# Patient Record
Sex: Female | Born: 1970 | Race: White | Hispanic: No | Smoking: Current every day smoker
Health system: Southern US, Community
[De-identification: ages and names within clinical notes are randomized; demographics above are authoritative.]

## PROBLEM LIST (undated history)

## (undated) DIAGNOSIS — J449 Chronic obstructive pulmonary disease, unspecified: Secondary | ICD-10-CM

## (undated) DIAGNOSIS — F32A Depression, unspecified: Secondary | ICD-10-CM

## (undated) DIAGNOSIS — E118 Type 2 diabetes mellitus with unspecified complications: Secondary | ICD-10-CM

## (undated) DIAGNOSIS — E669 Obesity, unspecified: Secondary | ICD-10-CM

## (undated) DIAGNOSIS — M1990 Unspecified osteoarthritis, unspecified site: Secondary | ICD-10-CM

## (undated) DIAGNOSIS — E781 Pure hyperglyceridemia: Secondary | ICD-10-CM

## (undated) DIAGNOSIS — F419 Anxiety disorder, unspecified: Secondary | ICD-10-CM

## (undated) DIAGNOSIS — I214 Non-ST elevation (NSTEMI) myocardial infarction: Secondary | ICD-10-CM

## (undated) DIAGNOSIS — I2510 Atherosclerotic heart disease of native coronary artery without angina pectoris: Secondary | ICD-10-CM

## (undated) DIAGNOSIS — Z720 Tobacco use: Secondary | ICD-10-CM

## (undated) DIAGNOSIS — I10 Essential (primary) hypertension: Secondary | ICD-10-CM

## (undated) DIAGNOSIS — E785 Hyperlipidemia, unspecified: Secondary | ICD-10-CM

## (undated) HISTORY — DX: Chronic obstructive pulmonary disease, unspecified: J44.9

## (undated) HISTORY — PX: CHOLECYSTECTOMY: SHX55

---

## 2002-03-24 ENCOUNTER — Emergency Department (HOSPITAL_COMMUNITY): Admission: EM | Admit: 2002-03-24 | Discharge: 2002-03-24 | Payer: Self-pay | Admitting: Emergency Medicine

## 2002-03-24 ENCOUNTER — Encounter: Payer: Self-pay | Admitting: Emergency Medicine

## 2003-03-22 ENCOUNTER — Encounter: Payer: Self-pay | Admitting: Emergency Medicine

## 2003-03-22 ENCOUNTER — Emergency Department (HOSPITAL_COMMUNITY): Admission: EM | Admit: 2003-03-22 | Discharge: 2003-03-22 | Payer: Self-pay | Admitting: Emergency Medicine

## 2004-07-29 ENCOUNTER — Inpatient Hospital Stay (HOSPITAL_COMMUNITY): Admission: EM | Admit: 2004-07-29 | Discharge: 2004-07-31 | Payer: Self-pay | Admitting: *Deleted

## 2004-08-23 ENCOUNTER — Emergency Department (HOSPITAL_COMMUNITY): Admission: EM | Admit: 2004-08-23 | Discharge: 2004-08-23 | Payer: Self-pay | Admitting: Emergency Medicine

## 2004-11-04 ENCOUNTER — Emergency Department (HOSPITAL_COMMUNITY): Admission: EM | Admit: 2004-11-04 | Discharge: 2004-11-04 | Payer: Self-pay | Admitting: Emergency Medicine

## 2006-05-01 ENCOUNTER — Emergency Department (HOSPITAL_COMMUNITY): Admission: EM | Admit: 2006-05-01 | Discharge: 2006-05-01 | Payer: Self-pay | Admitting: Emergency Medicine

## 2006-10-08 ENCOUNTER — Emergency Department (HOSPITAL_COMMUNITY): Admission: EM | Admit: 2006-10-08 | Discharge: 2006-10-08 | Payer: Self-pay | Admitting: Emergency Medicine

## 2006-10-29 ENCOUNTER — Emergency Department (HOSPITAL_COMMUNITY): Admission: EM | Admit: 2006-10-29 | Discharge: 2006-10-30 | Payer: Self-pay | Admitting: Emergency Medicine

## 2007-07-24 ENCOUNTER — Emergency Department (HOSPITAL_COMMUNITY): Admission: EM | Admit: 2007-07-24 | Discharge: 2007-07-24 | Payer: Self-pay | Admitting: Emergency Medicine

## 2007-09-30 ENCOUNTER — Emergency Department (HOSPITAL_COMMUNITY): Admission: EM | Admit: 2007-09-30 | Discharge: 2007-09-30 | Payer: Self-pay | Admitting: Emergency Medicine

## 2007-10-30 ENCOUNTER — Ambulatory Visit: Payer: Self-pay | Admitting: Cardiovascular Disease

## 2007-11-01 ENCOUNTER — Ambulatory Visit (HOSPITAL_COMMUNITY): Admission: RE | Admit: 2007-11-01 | Discharge: 2007-11-01 | Payer: Self-pay | Admitting: Cardiovascular Disease

## 2007-11-01 ENCOUNTER — Ambulatory Visit: Payer: Self-pay | Admitting: Cardiovascular Disease

## 2007-11-13 ENCOUNTER — Ambulatory Visit: Payer: Self-pay | Admitting: Cardiovascular Disease

## 2008-05-11 ENCOUNTER — Emergency Department (HOSPITAL_COMMUNITY): Admission: EM | Admit: 2008-05-11 | Discharge: 2008-05-11 | Payer: Self-pay | Admitting: Emergency Medicine

## 2008-10-03 ENCOUNTER — Emergency Department (HOSPITAL_COMMUNITY): Admission: EM | Admit: 2008-10-03 | Discharge: 2008-10-03 | Payer: Self-pay | Admitting: Emergency Medicine

## 2008-10-19 ENCOUNTER — Emergency Department (HOSPITAL_COMMUNITY): Admission: EM | Admit: 2008-10-19 | Discharge: 2008-10-19 | Payer: Self-pay | Admitting: Emergency Medicine

## 2010-06-04 ENCOUNTER — Emergency Department (HOSPITAL_COMMUNITY): Admission: EM | Admit: 2010-06-04 | Discharge: 2010-06-04 | Payer: Self-pay | Admitting: Emergency Medicine

## 2010-09-20 LAB — POCT I-STAT, CHEM 8
BUN: 11 mg/dL (ref 6–23)
Calcium, Ion: 1.08 mmol/L — ABNORMAL LOW (ref 1.12–1.32)
Chloride: 107 mEq/L (ref 96–112)
Creatinine, Ser: 1.1 mg/dL (ref 0.4–1.2)
Glucose, Bld: 159 mg/dL — ABNORMAL HIGH (ref 70–99)
HCT: 45 % (ref 36.0–46.0)
Hemoglobin: 15.3 g/dL — ABNORMAL HIGH (ref 12.0–15.0)
Potassium: 2.9 mEq/L — ABNORMAL LOW (ref 3.5–5.1)
Sodium: 140 mEq/L (ref 135–145)
TCO2: 25 mmol/L (ref 0–100)

## 2010-10-20 LAB — URINALYSIS, ROUTINE W REFLEX MICROSCOPIC
Glucose, UA: NEGATIVE mg/dL
Ketones, ur: NEGATIVE mg/dL
Leukocytes, UA: NEGATIVE
Nitrite: NEGATIVE
Protein, ur: 30 mg/dL — AB

## 2010-10-20 LAB — COMPREHENSIVE METABOLIC PANEL
ALT: 15 U/L (ref 0–35)
Alkaline Phosphatase: 63 U/L (ref 39–117)
CO2: 25 mEq/L (ref 19–32)
GFR calc non Af Amer: >60 mL/min (ref >60)
Glucose, Bld: 114 mg/dL — ABNORMAL HIGH (ref 70–99)
Potassium: 3.7 mEq/L (ref 3.5–5.1)
Sodium: 135 mEq/L (ref 135–145)
Total Bilirubin: 0.2 mg/dL — ABNORMAL LOW (ref 0.3–1.2)

## 2010-10-20 LAB — STOOL CULTURE

## 2010-10-20 LAB — OVA AND PARASITE EXAMINATION

## 2010-10-20 LAB — CBC
Hemoglobin: 13.8 g/dL (ref 12.0–15.0)
RBC: 4.39 MIL/uL (ref 3.87–5.11)
WBC: 11.4 10*3/uL — ABNORMAL HIGH (ref 4.0–10.5)

## 2010-10-20 LAB — URINE MICROSCOPIC-ADD ON

## 2010-10-20 LAB — CLOSTRIDIUM DIFFICILE EIA: C difficile Toxins A+B, EIA: NEGATIVE

## 2010-10-20 LAB — DIFFERENTIAL
Basophils Absolute: 0 10*3/uL (ref 0.0–0.1)
Basophils Relative: 0 % (ref 0–1)
Eosinophils Absolute: 0.1 10*3/uL (ref 0.0–0.7)
Eosinophils Relative: 1 % (ref 0–5)

## 2010-11-22 NOTE — Assessment & Plan Note (Signed)
[facility]                        CARDIOLOGY OFFICE NOTE   LA, Alyssa                       MRN:          [id number]  DATE:10/30/2007                            DOB:          08-19-1970    HISTORY:  [name] is a 36-year patient referred by [facility] for chest pain.  The patient was newly diagnosed  hypertensive, hyperlipidemic and diabetic.  This was at the end of  March.  She went to the emergency room at [facility] at the end of March  for chest pain and ruled out.   She continues to have pain.  The pain initially started off as fairly  atypical being somewhat sharp pain in the center of her chest.  It could  last for hours.  It happened when she is cleaning.  However over the  last 2 weeks, it has been more typical.  The pain is exertional.  It  radiates to her shoulder.  She is having 3-4 episodes a week.   The patient is extremely fearful.  She has 2 teenage kids at home and  was just diagnosed with all these risk factors, and is fearful that she  is going to drop dead.   I told the patient that her pain was initially atypical.  She had a  reassuring visit to the ER and has a normal EKG.  However, the fact that  she is getting exertional chest pain and is a diabetic with  hypercholesterolemia, and has been smoking for over 40 pack years, makes  me concerned.  The patient and I both agree that a diagnostic heart cath  would be in order.   The risks including bleeding, stroke, need for a blood transfusion or  emergency surgery were discussed.  She is willing to proceed.  We will  try to arrange this for Friday.   REVIEW OF SYSTEMS:  Otherwise negative, in particular she has not had  palpitations.  She has mild chronic exertional dyspnea due to her  smoking and clinical COPD as well as her central obesity.   PAST MEDICAL HISTORY:  1. Remarkable for recent diabetes.  2. Hypertension.  3. Smoking.  4. She had previous gallbladder surgery in 2003.   ALLERGIES:  NO KNOWN DRUG ALLERGIES.   SOCIAL HISTORY:  She smokes 1-2 packs a day.  She is doing this despite  being on Wellbutrin now.  The patient is unemployed.  She has a  longstanding boyfriend at home.  She has a 75 year old and a 108 year old  child at home.  She actually has a third child who is not living with  her.  She is sedentary.   FAMILY HISTORY:  Father died at age 47 of an MI.  Mother is still alive.   MEDICATIONS:  1. Wellbutrin 150 a day.  2. Metformin 500 b.i.d.  3. Lisinopril 20 a day.   PHYSICAL EXAMINATION:  GENERAL:  Remarkable for an overweight,  bronchitic female in no distress.  She is fairly anxious.  VITAL SIGNS:  Blood pressure is 100/80, pulse 72 and regular, weight  212, respiratory rate 14.  HEENT:  Unremarkable.  NECK:  Carotids are normal without bruit.  No lymphadenopathy,  thyromegaly, JVP elevation.  LUNGS:  Clear with mild end-expiratory wheezes.  Good diaphragmatic  motion.  HEART:  S1-S2 with distant sounds.  PMI normal.  ABDOMEN:  Benign.  Bowel sounds are positive.  No AAA.  No tenderness.  No bruit.  No hepatosplenomegaly or hepatojugular reflux.  Status post  gallbladder surgery.  EXTREMITIES:  Distal pulses are intact.  No edema.  No muscular  weakness.  NEURO:  Nonfocal.  SKIN:  Warm and dry.   DIAGNOSTICS:  EKG is normal.   IMPRESSION:  1. The patient is having chest pain with worrisome risk factors and an      exertional component.  Diagnostic heart cath will be done this      Friday.  The patient will have lab work done.  She had a chest x-      ray at the end of March which we will review and should suffice.  2. Hypertension.  Continue lisinopril 20 a day.  I may have her hold      this prior to her heart cath.  3. Metformin with diabetes to hold prior to surgery and heart cath due      to risk of lactic acidosis.  4. Smoking cessation.  Continue Wellbutrin.  Will need  baseline PFTs      and an inhaler given her clinical chronic obstructive pulmonary      disease.   Further recommendations based on results of heart cath.     Alyssa Morales. Eden Emms, MD, [facility]  Electronically Signed    PCN/MedQ  DD: 10/30/2007  DT: 10/30/2007  Job #: [id number]   cc:   [facility]  [place], [facility]

## 2010-11-25 NOTE — Cardiovascular Report (Signed)
Alyssa Morales, Alyssa Morales                ACCOUNT NO.:  [id number]   MEDICAL RECORD NO.:  [id number]           PATIENT TYPE:   LOCATION:                                 FACILITY:   PHYSICIAN:  Peter C. Eden Emms, MD, FACCDATE OF BIRTH:  12-Apr-1971   DATE OF PROCEDURE:  11/13/2007  DATE OF DISCHARGE:                            CARDIAC CATHETERIZATION   Coronary arteriography.   INDICATIONS:  Obese white female with multiple coronary risk factors  including diabetes and chest pain.   Cine catheterization was done with 4-French catheters from right femoral  artery.   Left main coronary artery was a long segment which was normal.   Left anterior descending artery was normal.  The distal vessel was  somewhat small.   The patient had a large branching first diagonal branch which was  normal.   The second diagonal branch and third diagonal branch were small without  significant disease.   The circumflex coronary artery was nondominant.   It primarily consisted of a single large obtuse marginal branch and was  normal.   The right coronary artery was dominant.  It was normal.   The patient had rather extensive distal posterior lateral branch.   RAO VENTRICULOGRAPHY:  RAO ventriculography was normal, EF was 65%.  There was no gradient across the aortic valve and no MR.  LV pressure  was 160/80.  Aortic pressure was 130/80.  LV pressure was 130/15.   IMPRESSION:  The patient has no significant coronary artery disease.  She will be discharged later today as long as her groin heals well.  She  needs to continue risk factor modifications.  She was given an albuterol  inhaler to help with her asthmatic bronchitis and smoking.      Alyssa Morales. Eden Emms, MD, [facility]  Electronically Signed     PCN/MEDQ  D:  11/13/2007  T:  11/14/2007  Job:  [id number]

## 2010-11-25 NOTE — Assessment & Plan Note (Signed)
[facility]                       [name] CARDIOLOGY OFFICE NOTE   Alyssa Morales, Alyssa Morales                       MRN:          [id number]  DATE:11/13/2007                            DOB:          1971/06/10    Alyssa Morales returns in follow-up.  I catheterized her about a week ago.  She had multiple coronary risk factors including hypertension, diabetes  and smoking with chest pain.  Her catheterization showed normal  coronaries with good LV function.   Her leg is healed well since discharge.   She continues to have atypical pain and a bronchitic cough.  She has had  some benefit from an inhaler I prescribed for her.   She needs to [facility]  her diabetes and risk factors.  There is no fasting lipid profile on  her.  Given her weight and diabetes, I suspect she should be on a  statin.  I told her I would give her a prescription for simvastatin 40  mg and we will check her lipids this week.   REVIEW OF SYSTEMS:  Otherwise negative.  In particular, her groin is  healed well.  She is not having any significant chest pain and has had  relief from her albuterol inhaler.   MEDICATIONS:  1. Wellbutrin 150 mg a day.  2. Metformin 500 mg b.i.d.  3. Lisinopril 20 mg a day.  4. A baby aspirin a day.   EXAM:  Remarkable for an overweight white female in no distress.  Weight is 207, blood pressure is 110/76, pulse 76 and regular, afebrile,  respiratory rate 14.  HEENT:  Unremarkable.  Carotids normal without bruit.  No lymphadenopathy, thyromegaly or JVP  elevation.  LUNGS:  Clear with good diaphragmatic motion.  No wheezing.  ABDOMEN:  Protuberant.  Bowel sounds positive.  No bruit.  No  tenderness.  Status post cholecystectomy.  No hepatosplenomegaly or  hepatojugular reflux.  Groin site is well-healed without residual bruit.  Distal pulses are intact.  No edema.  NEUROLOGIC:  Nonfocal.  SKIN:  Warm and  dry.  No muscle weakness.   IMPRESSION:  1. Chest pain.  No evidence of coronary artery disease by      catheterization.  Continue aspirin a day.  2. Smoking.  Continue Wellbutrin.  The patient clinically has some      bronchitis.  Continue albuterol inhaler.  3. Diabetes.  Follow up with Dr. Jorene Guest and [name].  Continue      metformin 500 mg b.i.d.  Continue to check sugars post meal.      Hemoglobin A1c quarterly.  4. Hypertension.  Continue lisinopril 20 mg a day.  Blood pressure      under good control.  Renal protective effects from lisinopril also      beneficial.   We will see her back in a year.     Alyssa Morales. Eden Emms, MD, [facility]  Electronically Signed    PCN/MedQ  DD: 11/13/2007  DT: 11/13/2007  Job #: [id number]

## 2010-11-25 NOTE — Op Note (Signed)
Alyssa Morales, Alyssa Morales                ACCOUNT NO.:  [id number]   MEDICAL RECORD NO.:  [id number]          PATIENT TYPE:  INP   LOCATION:  A305                          FACILITY:  [facility]   PHYSICIAN:  Dirk Dress. Katrinka Blazing, M.D.   DATE OF BIRTH:  05-18-1971   DATE OF PROCEDURE:  07/30/2004  DATE OF DISCHARGE:                                 OPERATIVE REPORT   PREOPERATIVE DIAGNOSIS:  Acute cholecystitis, cholelithiasis   POSTOPERATIVE DIAGNOSIS:  Acute cholecystitis, cholelithiasis   PROCEDURE:  Laparoscopic cholecystectomy.   SURGEON:  Dr. Katrinka Blazing.   DESCRIPTION:  Under general endotracheal anesthesia, the patient's abdomen  was prepped and draped in  a sterile field. A supraumbilical incision was  made and Veress needle was inserted uneventfully. Abdomen was insufflated  with 2.9 liters of CO2. Using a Visiport guide, a 10-mm port was placed  uneventfully. Laparoscope was placed, and the gallbladder was visualized.  Under videoscopic guidance, a 10-mm port and two 5-mm ports were placed in  the right subcostal region. The gallbladder was grasped and then positioned.  Cystic duct was dissected close to the gallbladder. It was closed with five  clips and divided. There were two cystic artery branches, one anteriorly and  one posteriorly. These were followed up to the gallbladder wall. There were  clipped with three clips and divided consecutively. Using electrocautery,  gallbladder was then separated from the intrahepatic bed without difficulty.  The gallbladder was retrieved in an EndoCatch device. Clinically, there was  only a very large stone which was impacted in the cystic duct.   The subhepatic space was irrigated with saline. There was no bleeding and no  evidence of bile leak from the bed. CO2 was allowed to escape from the  abdomen, and the ports were removed. The incisions were closed using 0  Vicryl on the fascia at the umbilicus and staples each of the skin  incisions. The patient  tolerated the procedure well. Dressings were placed.  She was awakened from anesthesia without difficulty, transferred to a bed  and taken to the postanesthetic care unit for monitoring.      LCS/MEDQ  D:  07/30/2004  T:  07/30/2004  Job:  [id number]

## 2010-11-25 NOTE — Discharge Summary (Signed)
Alyssa Morales, Alyssa Morales                ACCOUNT NO.:  [id number]   MEDICAL RECORD NO.:  [id number]          PATIENT TYPE:  INP   LOCATION:  A305                          FACILITY:  [facility]   PHYSICIAN:  Dirk Dress. Katrinka Blazing, M.D.   DATE OF BIRTH:  [birth year]   DATE OF ADMISSION:  07/29/2004  DATE OF DISCHARGE:  01/22/2006LH                                 DISCHARGE SUMMARY   DISCHARGE DIAGNOSES:  1.  Acute cholecystitis/cholelithiasis.  2.  Gastroesophageal reflux disease.   SPECIAL PROCEDURE:  Laparoscopic cholecystectomy.   DISPOSITION:  The patient is discharged home in satisfactory condition. She  will be followed up in the office in two weeks.   DISCHARGE MEDICATIONS:  1.  Phenergan 25 mg q.4h. p.r.n. nausea.  2.  Vicodin 5/500 one q.i.d. p.r.n. pain.  3.  Prevacid 30 mg q.d.  4.  Lexapro 20 mg q.d.  5.  Wellbutrin XL 300 mg q.d.  6.  Allegra D 1 b.i.d.  7.  Klonopin 2 mg q.h.s.   SUMMARY:  A 40 year old female admitted with a history of acute onset  abdominal pain, nausea, vomiting. The patient was seen in the emergency room  where she was found to have a tender epigastrium. Ultrasound revealed  gallstones with thickened gallbladder wall. She had no fever. White count  was 11,700. She was admitted for IV antibiotics and was scheduled for  laparoscopic cholecystectomy. History suggests that she has had similar  episodes at least six times in the last two weeks.   PHYSICAL EXAMINATION:  GENERAL:  She was in no acute distress.  VITAL SIGNS:  Blood pressure was 123/84, pulse 79, respirations 20,  temperature 98.5.  ABDOMEN:  Revealed mild tenderness in the epigastrium.   STUDIES:  Ultrasound showed multiple gallstones with thickening of the  gallbladder wall but no pericholecystic fluid. Liver enzymes were normal.  Amylase and lipase were normal.   The patient was admitted, started on IV antibiotics and antiemetics. She  underwent laparoscopic cholecystectomy on January 21 with the  finding of  acute cholecystitis with cholecystectomy. She had an uneventful  postoperative course and was discharged home on the first postoperative day  in satisfactory condition.      LCS/MEDQ  D:  09/04/2004  T:  09/05/2004  Job:  [id number]

## 2010-11-25 NOTE — H&P (Signed)
Alyssa Morales, Alyssa Morales                ACCOUNT NO.:  [id number]   MEDICAL RECORD NO.:  [id number]          PATIENT TYPE:  INP   LOCATION:  A305                          FACILITY:  [facility]   PHYSICIAN:  Dirk Dress. Katrinka Blazing, M.D.   DATE OF BIRTH:  01-22-1971   DATE OF ADMISSION:  07/29/2004  DATE OF DISCHARGE:  LH                                HISTORY & PHYSICAL   HISTORY OF PRESENT ILLNESS:  The patient is a 40 year old female admitted  with a history of acute onset of abdominal pain, nausea and vomiting while  sleeping in the early morning of admission.  The patient was later seen in  the emergency room where on examination, she was found to have a tender  epigastrium.  An ultrasound of the abdomen revealed gallstones with a  thickened gallbladder wall compatible with acute cholecystitis.  The patient  had no fever, and only had a white count of 11,700.  She was treated  symptomatically and was improved, but she still had significant tenderness.  She is admitted for IV antibiotics.  We will schedule her for a laparoscopic  cholecystectomy.  The patient states that she has had similar episodes at  least six times in the last two weeks.   PAST MEDICAL HISTORY:  Positive for gastroesophageal reflux disease,  depression and seasonal allergies.   MEDICATIONS:  1.  Prevacid 30 mg daily.  2.  Lexapro 20 mg daily.  3.  Wellbutrin XL 300 mg daily.  4.  Allegra D, b.i.d.  5.  Klonopin 1 mg, two tablets q.h.s.   REVIEW OF SYSTEMS:  Positive for irregular menses and mild depression and  anxiety.  Her only surgery was a C-section in 1989.   FAMILY HISTORY:  Positive for atherosclerotic heart disease in her father.   SOCIAL HISTORY:  She is a divorced mother of three children, employed.  She  smokes one pack of cigarettes per day.   PHYSICAL EXAMINATION:  GENERAL:  She is a pleasant female in no acute  distress at the time of my exam.  VITAL SIGNS:  Blood pressure is 123/84, pulse is 79,  respirations 20,  temperature 98.5, oxygen saturation is 98% on room air.  HEENT:  Unremarkable.  There is no evidence of jaundice.  Oral mucosa is  normal.  There is no bilious staining.  No evidence of dehydration  clinically.  NECK:  Supple, no JVD, bruits, adenopathy or thyromegaly.  CHEST:  Clear to auscultation.  HEART:  Regular rate and rhythm without murmur, gallop or rub.  ABDOMEN:  Obese but soft.  Mild tenderness in the epigastrium.  No right  upper quadrant tenderness. No masses, no guarding.  EXTREMITIES:  No cyanosis, clubbing or edema.  No joint deformity.  NEUROLOGIC:  No focal, motor, sensory or cerebellar deficits.   LABORATORY DATA:  White count 11,700, hemoglobin 13.1, hematocrit 37.  Potassium 3.6.  BUN 13, creatinine 1.  SGOT/SGPT, alkaline phosphatase and  bilirubin are normal.  Lipase, amylase are normal.  Cardiac enzymes were  negative for myocardial injury.  Her D-Dimer was 0.35.  Urinalysis  was  normal.   Chest x-ray showed no evidence of acute cardiopulmonary disease.   Upper abdominal ultrasound shows multiple gallstones with thickening of the  gallbladder wall, but no pericholecystic fluid.   IMPRESSION:  1.  Acute cholecystitis with cholelithiasis.  2.  Gastroesophageal reflux disease by history.  3.  Seasonal allergies.  4.  Depression.   PLAN:  The patient is admitted. She will be given antiemetics and  analgesics.  She will also be given Ancef for antibiotic coverage.  Since  she has not been n.p.o., we will wait and do surgery on the morning of  July 30, 2004.  The patient has been counseled for the procedure, and she  agrees to follow through with surgery on the morning of July 30, 2004.      LCS/MEDQ  D:  07/29/2004  T:  07/29/2004  Job:  [id number]   cc:   [facility]  Fax: [phone]   Laural Benes, Dr.  Roda Shutters [facility]  [place], [place].

## 2011-03-04 ENCOUNTER — Encounter: Payer: Self-pay | Admitting: Emergency Medicine

## 2011-03-04 ENCOUNTER — Emergency Department (HOSPITAL_COMMUNITY)
Admission: EM | Admit: 2011-03-04 | Discharge: 2011-03-04 | Disposition: A | Discharge status: Home / Self Care | Payer: Self-pay | Attending: Emergency Medicine | Admitting: Emergency Medicine

## 2011-03-04 DIAGNOSIS — Y92009 Unspecified place in unspecified non-institutional (private) residence as the place of occurrence of the external cause: Secondary | ICD-10-CM | POA: Insufficient documentation

## 2011-03-04 DIAGNOSIS — IMO0002 Reserved for concepts with insufficient information to code with codable children: Secondary | ICD-10-CM | POA: Insufficient documentation

## 2011-03-04 DIAGNOSIS — X58XXXA Exposure to other specified factors, initial encounter: Secondary | ICD-10-CM | POA: Insufficient documentation

## 2011-03-04 DIAGNOSIS — F17200 Nicotine dependence, unspecified, uncomplicated: Secondary | ICD-10-CM | POA: Insufficient documentation

## 2011-03-04 DIAGNOSIS — E119 Type 2 diabetes mellitus without complications: Secondary | ICD-10-CM | POA: Insufficient documentation

## 2011-03-04 DIAGNOSIS — T148XXA Other injury of unspecified body region, initial encounter: Secondary | ICD-10-CM

## 2011-03-04 DIAGNOSIS — I10 Essential (primary) hypertension: Secondary | ICD-10-CM | POA: Insufficient documentation

## 2011-03-04 HISTORY — DX: Essential (primary) hypertension: I10

## 2011-03-04 MED ORDER — CYCLOBENZAPRINE HCL 10 MG PO TABS
10.0000 mg | ORAL_TABLET | Freq: Once | ORAL | Status: AC
  Administered 2011-03-04: 10 mg via ORAL
  Filled 2011-03-04: qty 1

## 2011-03-04 MED ORDER — CYCLOBENZAPRINE HCL 10 MG PO TABS: 10.0000 mg | ORAL_TABLET | Freq: Three times a day (TID) | ORAL | Status: AC | PRN

## 2011-03-04 MED ORDER — OXYCODONE-ACETAMINOPHEN 5-325 MG PO TABS
1.0000 | ORAL_TABLET | Freq: Once | ORAL | Status: AC
  Administered 2011-03-04: 1 via ORAL
  Filled 2011-03-04: qty 1

## 2011-03-04 MED ORDER — OXYCODONE-ACETAMINOPHEN 5-325 MG PO TABS: 1.0000 | ORAL_TABLET | ORAL | Status: AC | PRN

## 2011-03-04 NOTE — ED Notes (Signed)
Patient c/o pain in right shoulder. Per patient pain radiates into neck/collar bone and down arm. Per patient doing yard work yesterday, woke up with pain this morning.

## 2011-03-04 NOTE — ED Provider Notes (Signed)
History     CSN: [id number] Arrival date & time: 03/04/2011 11:00 AM  Chief Complaint  Patient presents with  . Shoulder Pain   HPI Comments: Patient c/o pain to her right neck , shoulder and upper arm that began after mowing and weed eating yesterday.  Staes she woke this mroning with pain to move her neck or right arm.  She denies fall, chest pain, numbness, weakness or shortness of breath  Patient is a 40 y.o. female presenting with shoulder pain. The history is provided by the patient.  Shoulder Pain The current episode started yesterday. The problem occurs constantly. The problem has been unchanged. Associated symptoms include arthralgias and neck pain. Pertinent negatives include no abdominal pain, chest pain, diaphoresis, fatigue, fever, headaches, joint swelling, myalgias, nausea, numbness, rash, sore throat, visual change, vomiting or weakness. Exacerbated by: movement. Treatments tried: muscle creams. The treatment provided no relief.    Past Medical History  Diagnosis Date  . Diabetes mellitus   . Hypertension     Past Surgical History  Procedure Date  . Cholecystectomy     History reviewed. No pertinent family history.  History  Substance Use Topics  . Smoking status: Former Smoker -- 1.5 packs/day for 20 years    Types: Cigarettes  . Smokeless tobacco: Never Used  . Alcohol Use: Yes     once a month    OB History    Grav Para Term Preterm Abortions TAB SAB Ect Mult Living   3 3 3       3       Review of Systems  Constitutional: Negative for fever, diaphoresis and fatigue.  HENT: Positive for neck pain. Negative for sore throat and neck stiffness.   Cardiovascular: Negative.  Negative for chest pain.  Gastrointestinal: Negative for nausea, vomiting and abdominal pain.  Musculoskeletal: Positive for arthralgias. Negative for myalgias, back pain, joint swelling and gait problem.  Skin: Negative.  Negative for rash.  Neurological: Negative for dizziness,  facial asymmetry, weakness, numbness and headaches.  Hematological: Does not bruise/bleed easily.  All other systems reviewed and are negative.    Physical Exam  BP 142/94  Pulse 80  Temp(Src) 98.2 F (36.8 C) (Oral)  Resp 18  Ht 5' 4.5" (1.638 m)  Wt 200 lb (90.719 kg)  BMI 33.80 kg/m2  SpO2 98%  LMP 02/16/2011  Physical Exam  Nursing note and vitals reviewed. Constitutional: She is oriented to person, place, and time. She appears well-developed and well-nourished. No distress.  HENT:  Head: Normocephalic and atraumatic.  Mouth/Throat: Oropharynx is clear and moist.  Eyes: EOM are normal. Pupils are equal, round, and reactive to light.  Neck: Trachea normal and normal range of motion. Muscular tenderness present. No spinous process tenderness present. No rigidity. No edema and no erythema present. No Brudzinski's sign and no Kernig's sign noted.  Cardiovascular: Normal rate, regular rhythm and normal heart sounds.   Pulmonary/Chest: Effort normal and breath sounds normal.  Musculoskeletal: She exhibits tenderness. She exhibits no edema.       Right shoulder: She exhibits tenderness. She exhibits normal range of motion, no bony tenderness, no swelling, no effusion, no crepitus, no deformity, no laceration, normal pulse and normal strength.  Lymphadenopathy:    She has no cervical adenopathy.  Neurological: She is alert and oriented to person, place, and time. She has normal reflexes. She displays normal reflexes. No cranial nerve deficit. She exhibits normal muscle tone. Coordination normal.  Skin: Skin is warm and dry.  ED Course  ORTHOPEDIC INJURY TREATMENT Date/Time: 03/04/2011 12:43 PM Performed by: Trisha Mangle, Audon Heymann L. Authorized by: Pollyann Savoy Consent: Verbal consent obtained. Written consent not obtained. Consent given by: patient Patient understanding: patient states understanding of the procedure being performed Patient consent: the patient's understanding  of the procedure matches consent given Procedure consent: procedure consent matches procedure scheduled Patient identity confirmed: verbally with patient Time out: Immediately prior to procedure a "time out" was called to verify the correct patient, procedure, equipment, support staff and site/side marked as required. Injury location: shoulder Location details: right shoulder Injury type: soft tissue Pre-procedure neurovascular assessment: neurovascularly intact Pre-procedure distal perfusion: normal Pre-procedure neurological function: normal Pre-procedure range of motion: normal Local anesthesia used: no Patient sedated: no Immobilization: sling Post-procedure neurovascular assessment: post-procedure neurovascularly intact Post-procedure distal perfusion: normal Post-procedure neurological function: normal Post-procedure range of motion: normal Patient tolerance: Patient tolerated the procedure well with no immediate complications.    MDM  1145  Ttp of the right SCM muscle, trapezius and right deltoid.  Pain is reproduced with abduction and ROM of the neck and right shoulder.  No spinal tenderness, no focal neuro deficits, chest pain or weakness.  Likely muscular strain.     The patient appears reasonably screened and/or stabilized for discharge and I doubt any other medical condition or other [facility] requiring further screening, evaluation, or treatment in the ED at this time prior to discharge.   Pt feels improved after observation and/or treatment in ED.      OUTPATIENT MEDICATIONS PRESCRIBED FROM THE ED:   New Prescriptions   CYCLOBENZAPRINE (FLEXERIL) 10 MG TABLET    Take 1 tablet (10 mg total) by mouth 3 (three) times daily as needed for muscle spasms.   OXYCODONE-ACETAMINOPHEN (PERCOCET) 5-325 MG PER TABLET    Take 1 tablet by mouth every 4 (four) hours as needed for pain.          Abdulrahim Siddiqi L. Navi Erber, PA 03/04/11 1252  Maricsa Sammons L. Zakiyyah [place], [place] 03/04/11 1253

## 2011-03-04 NOTE — ED Notes (Signed)
Sling applied per PA order

## 2011-03-04 NOTE — ED Provider Notes (Signed)
Medical screening examination/treatment/procedure(s) were performed by non-physician practitioner and as supervising physician I was immediately available for consultation/collaboration.   Charles B. Bernette Mayers, MD 03/04/11 1254

## 2011-03-07 ENCOUNTER — Emergency Department (HOSPITAL_COMMUNITY)
Admission: EM | Admit: 2011-03-07 | Discharge: 2011-03-07 | Disposition: A | Discharge status: Home / Self Care | Payer: Self-pay | Attending: Emergency Medicine | Admitting: Emergency Medicine

## 2011-03-07 ENCOUNTER — Encounter (HOSPITAL_COMMUNITY): Payer: Self-pay

## 2011-03-07 DIAGNOSIS — Z87891 Personal history of nicotine dependence: Secondary | ICD-10-CM | POA: Insufficient documentation

## 2011-03-07 DIAGNOSIS — X58XXXA Exposure to other specified factors, initial encounter: Secondary | ICD-10-CM | POA: Insufficient documentation

## 2011-03-07 DIAGNOSIS — E119 Type 2 diabetes mellitus without complications: Secondary | ICD-10-CM | POA: Insufficient documentation

## 2011-03-07 DIAGNOSIS — IMO0002 Reserved for concepts with insufficient information to code with codable children: Secondary | ICD-10-CM | POA: Insufficient documentation

## 2011-03-07 DIAGNOSIS — I10 Essential (primary) hypertension: Secondary | ICD-10-CM | POA: Insufficient documentation

## 2011-03-07 MED ORDER — DEXAMETHASONE 6 MG PO TABS: ORAL_TABLET | ORAL | Status: DC

## 2011-03-07 MED ORDER — OXYCODONE-ACETAMINOPHEN 5-325 MG PO TABS: ORAL_TABLET | ORAL | Status: DC

## 2011-03-07 MED ORDER — DEXAMETHASONE SODIUM PHOSPHATE 4 MG/ML IJ SOLN
8.0000 mg | Freq: Once | INTRAMUSCULAR | Status: AC
  Administered 2011-03-07: 8 mg via INTRAMUSCULAR
  Filled 2011-03-07: qty 2

## 2011-03-07 NOTE — ED Notes (Signed)
PA in with pt at this time  

## 2011-03-07 NOTE — ED Notes (Signed)
Complain of pain in right shoulder. Here Saturday for same

## 2011-03-07 NOTE — ED Provider Notes (Signed)
History     CSN: [id number] Arrival date & time: 03/07/2011 10:33 AM  Chief Complaint  Patient presents with  . Shoulder Pain   HPI Comments: Pt feels she is loosing the strength in the right hand. Difficulty holding her cup or combing hair.The pain is "terrible". Pt was seen in ED a few days ago. She is using the flexeril and the ibuprofen without relief. Pt states this started with mowing and  Weeding.   Patient is a 40 y.o. female presenting with shoulder pain. The history is provided by the patient.  Shoulder Pain This is a new problem. The current episode started in the past 7 days. The problem occurs 2 to 4 times per day. The problem has been gradually worsening. Pertinent negatives include no abdominal pain, arthralgias, chest pain, coughing or neck pain. The symptoms are aggravated by exertion. She has tried acetaminophen and NSAIDs (muscle relaxant) for the symptoms. The treatment provided no relief.    Past Medical History  Diagnosis Date  . Diabetes mellitus   . Hypertension     Past Surgical History  Procedure Date  . Cholecystectomy     History reviewed. No pertinent family history.  History  Substance Use Topics  . Smoking status: Former Smoker -- 1.5 packs/day for 20 years    Types: Cigarettes  . Smokeless tobacco: Never Used  . Alcohol Use: Yes     once a month    OB History    Grav Para Term Preterm Abortions TAB SAB Ect Mult Living   3 3 3       3       Review of Systems  Constitutional: Negative for activity change.       All ROS Neg except as noted in HPI  HENT: Negative for nosebleeds and neck pain.   Eyes: Negative for photophobia and discharge.  Respiratory: Negative for cough, shortness of breath and wheezing.   Cardiovascular: Negative for chest pain and palpitations.  Gastrointestinal: Negative for abdominal pain and blood in stool.  Genitourinary: Negative for dysuria, frequency and hematuria.  Musculoskeletal: Negative for back pain and  arthralgias.  Skin: Negative.   Neurological: Negative for dizziness, seizures and speech difficulty.  Psychiatric/Behavioral: Negative for hallucinations and confusion.    Physical Exam  BP 139/88  Pulse 85  Temp(Src) 98.7 F (37.1 C) (Oral)  Resp 18  Ht 5\' 4"  (1.626 m)  Wt 200 lb (90.719 kg)  BMI 34.33 kg/m2  SpO2 99%  LMP 02/16/2011  Physical Exam  Nursing note and vitals reviewed. Constitutional: She is oriented to person, place, and time. She appears well-developed and well-nourished.  Non-toxic appearance.  HENT:  Head: Normocephalic.  Right Ear: Tympanic membrane and external ear normal.  Left Ear: Tympanic membrane and external ear normal.  Eyes: EOM and lids are normal. Pupils are equal, round, and reactive to light.  Neck: Normal range of motion. Neck supple. Carotid bruit is not present.       FROM. No bruits.  Pain with some ROM.  Cardiovascular: Normal rate, regular rhythm, normal heart sounds, intact distal pulses and normal pulses.   Pulmonary/Chest: Breath sounds normal. No respiratory distress.  Abdominal: Soft. Bowel sounds are normal. There is no tenderness. There is no guarding.  Musculoskeletal: Normal range of motion.       Pain with ROM of the right shoulder. NO sensory changes. Grip symetrical  Lymphadenopathy:       Head (right side): No submandibular adenopathy present.  Head (left side): No submandibular adenopathy present.    She has no cervical adenopathy.  Neurological: She is alert and oriented to person, place, and time. She has normal strength. No cranial nerve deficit or sensory deficit.       No gross motor deficit noted. No sensory changes  Skin: Skin is warm and dry.  Psychiatric: She has a normal mood and affect. Her speech is normal.    ED Course: Records from Sat 8/26 reviewed.  Procedures  I have reviewed nursing notes, vital signs, and all appropriate lab and imaging results for this patient.      Kathie Dike,  [place] 03/16/11 2038

## 2011-03-18 NOTE — ED Provider Notes (Signed)
Medical screening examination/treatment/procedure(s) were performed by non-physician practitioner and as supervising physician I was immediately available for consultation/collaboration.  Donnetta Hutching, MD 03/18/11 1343

## 2011-03-29 LAB — I-STAT 8, (EC8 V) (CONVERTED LAB)
Acid-Base Excess: 3 — ABNORMAL HIGH
Glucose, Bld: 140 — ABNORMAL HIGH
HCT: 44
Hemoglobin: 15
Potassium: 3.3 — ABNORMAL LOW
Sodium: 138
TCO2: 28

## 2011-04-03 LAB — URINALYSIS, ROUTINE W REFLEX MICROSCOPIC
Leukocytes, UA: NEGATIVE
Urobilinogen, UA: 0.2

## 2011-04-03 LAB — CBC
MCHC: 35.4
RDW: 12.4

## 2011-04-03 LAB — URINE MICROSCOPIC-ADD ON

## 2011-04-03 LAB — BASIC METABOLIC PANEL
BUN: 13
Calcium: 9
Creatinine, Ser: 0.73
GFR calc non Af Amer: >60
Glucose, Bld: 292 — ABNORMAL HIGH

## 2011-04-03 LAB — DIFFERENTIAL
Basophils Absolute: 0.1
Basophils Relative: 1
Neutro Abs: 6.4
Neutrophils Relative %: 60

## 2011-04-03 LAB — POCT CARDIAC MARKERS: Operator id: 179121

## 2011-04-11 LAB — BASIC METABOLIC PANEL
CO2: 27
Glucose, Bld: 100 — ABNORMAL HIGH
Potassium: 4
Sodium: 138

## 2011-04-11 LAB — DIFFERENTIAL
Basophils Absolute: 0.1
Basophils Relative: 0
Eosinophils Absolute: 0.1
Eosinophils Relative: 1
Monocytes Absolute: 0.7

## 2011-04-11 LAB — CBC
HCT: 36.5
Hemoglobin: 12.6
MCHC: 34.6
MCV: 90.8
RDW: 13.1

## 2011-04-11 LAB — POCT CARDIAC MARKERS: Troponin i, poc: <0.05

## 2012-10-25 ENCOUNTER — Emergency Department (HOSPITAL_COMMUNITY): Payer: Self-pay

## 2012-10-25 ENCOUNTER — Emergency Department (HOSPITAL_COMMUNITY)
Admission: EM | Admit: 2012-10-25 | Discharge: 2012-10-26 | Disposition: A | Discharge status: Home / Self Care | Payer: Self-pay | Attending: Emergency Medicine | Admitting: Emergency Medicine

## 2012-10-25 ENCOUNTER — Encounter (HOSPITAL_COMMUNITY): Payer: Self-pay | Admitting: *Deleted

## 2012-10-25 DIAGNOSIS — M25562 Pain in left knee: Secondary | ICD-10-CM

## 2012-10-25 DIAGNOSIS — E119 Type 2 diabetes mellitus without complications: Secondary | ICD-10-CM | POA: Insufficient documentation

## 2012-10-25 DIAGNOSIS — M25569 Pain in unspecified knee: Secondary | ICD-10-CM | POA: Insufficient documentation

## 2012-10-25 DIAGNOSIS — IMO0002 Reserved for concepts with insufficient information to code with codable children: Secondary | ICD-10-CM | POA: Insufficient documentation

## 2012-10-25 DIAGNOSIS — I10 Essential (primary) hypertension: Secondary | ICD-10-CM | POA: Insufficient documentation

## 2012-10-25 DIAGNOSIS — Z79899 Other long term (current) drug therapy: Secondary | ICD-10-CM | POA: Insufficient documentation

## 2012-10-25 DIAGNOSIS — F17200 Nicotine dependence, unspecified, uncomplicated: Secondary | ICD-10-CM | POA: Insufficient documentation

## 2012-10-25 MED ORDER — HYDROCODONE-ACETAMINOPHEN 5-325 MG PO TABS
1.0000 | ORAL_TABLET | Freq: Once | ORAL | Status: AC
  Administered 2012-10-25: 1 via ORAL
  Filled 2012-10-25: qty 1

## 2012-10-25 NOTE — ED Provider Notes (Signed)
History     CSN: [id number]  Arrival date & time 10/25/12  2301   First MD Initiated Contact with Patient 10/25/12 2312      Chief Complaint  Patient presents with  . Knee Pain    (Consider location/radiation/quality/duration/timing/severity/associated sxs/prior treatment) HPI Comments: Patient with intermittent pain to her left knee, complains of increased pain to her knee for 2 days.  She describes a sharp, intense pain to her knee with certain movements especially going up and down steps.  She states the pain will improve after rest and Aleve, but pain reoccurs.  She denies known injury, numbness, swelling , calf pain, or redness to the joint   Patient is a 42 y.o. female presenting with knee pain. The history is provided by the patient.  Knee Pain Location:  Knee Time since incident:  2 days Knee location:  L knee Pain details:    Quality:  Aching   Radiates to:  Does not radiate   Severity:  Moderate   Onset quality:  Gradual   Progression:  Waxing and waning Chronicity:  Recurrent Foreign body present:  No foreign bodies Prior injury to area:  Unable to specify Relieved by:  NSAIDs Worsened by:  Flexion and bearing weight Ineffective treatments:  None tried Associated symptoms: no back pain, no decreased ROM, no fever, no muscle weakness, no neck pain, no numbness, no stiffness, no swelling and no tingling     Past Medical History  Diagnosis Date  . Diabetes mellitus   . Hypertension     Past Surgical History  Procedure Laterality Date  . Cholecystectomy    . Cesarean section      History reviewed. No pertinent family history.  History  Substance Use Topics  . Smoking status: Current Every Day Smoker -- 1.50 packs/day for 20 years    Types: Cigarettes  . Smokeless tobacco: Never Used  . Alcohol Use: Yes     Comment: once a month    OB History   Grav Para Term Preterm Abortions TAB SAB Ect Mult Living   3 3 3       3       Review of Systems   Constitutional: Negative for fever and chills.  HENT: Negative for neck pain.   Genitourinary: Negative for dysuria and difficulty urinating.  Musculoskeletal: Positive for arthralgias. Negative for back pain, joint swelling, gait problem and stiffness.  Skin: Negative for color change, rash and wound.  All other systems reviewed and are negative.    Allergies  Review of patient's allergies indicates no known allergies.  Home Medications   Current Outpatient Rx  Name  Route  Sig  Dispense  Refill  . albuterol (PROVENTIL,VENTOLIN) 90 MCG/ACT inhaler   Inhalation   Inhale 2 puffs into the lungs every 6 (six) hours as needed. Congestion          . dexamethasone (DECADRON) 6 MG tablet      1 po bid with food   10 tablet   0   . diphenhydramine-acetaminophen (TYLENOL PM) 25-500 MG TABS   Oral   Take 2 tablets by mouth at bedtime as needed. Sleep/Pain          . Ibuprofen (ADVIL) 200 MG CAPS   Oral   Take 2 capsules by mouth 2 (two) times daily as needed. FOR PAIN          . naproxen sodium (ANAPROX) 220 MG tablet   Oral   Take 220 mg by mouth  2 (two) times daily with a meal. Pain          . oxyCODONE-acetaminophen (PERCOCET) 5-325 MG per tablet      1 po q6h prn pain   20 tablet   0     BP 152/108  Pulse 90  Temp(Src) 98.3 F (36.8 C) (Oral)  Resp 18  Ht 5\' 4"  (1.626 m)  Wt 207 lb (93.895 kg)  BMI 35.51 kg/m2  SpO2 96%  LMP 10/11/2012  Physical Exam  Nursing note and vitals reviewed. Constitutional: She is oriented to person, place, and time. She appears well-developed and well-nourished. No distress.  HENT:  Head: Normocephalic and atraumatic.  Cardiovascular: Normal rate, regular rhythm, normal heart sounds and intact distal pulses.   Pulmonary/Chest: Effort normal and breath sounds normal.  Musculoskeletal: She exhibits tenderness. She exhibits no edema.  ttp of the anterior left knee. Pain is reproduced with movement of the patella, primarily  medial patellar stress,   No erythema, crepitus, bruising or step-off deformity.  Dp pulse is brisk, distal sensation intact.  Pt has full ROM of the knee  Neurological: She is alert and oriented to person, place, and time. She exhibits normal muscle tone. Coordination normal.  Skin: Skin is warm and dry. No erythema.    ED Course  Procedures (including critical care time)  Labs Reviewed - No data to display No results found.  Dg Knee Complete 4 Views Left  10/26/2012  *RADIOLOGY REPORT*  Clinical Data: Left knee pain with bending on and off for months, no history of injury  LEFT KNEE - COMPLETE 4+ VIEW  Comparison: None  Findings: Bone mineralization normal. Joint spaces preserved. No fracture, dislocation, or bone destruction. No joint effusion.  IMPRESSION: No acute abnormalities.   Original Report Authenticated By: Ulyses Southward, M.D.      MDM     Knee immobilizer applied and crutches given, pain improved and remains NV intact.    There is no erythema, edema, crepitus or effusion on exam, x-ray is negative.  Pt is well appearing, I do not suspect septic joint.  She agrees to elevate, ice and call orthopedics next week to arrange f/u.  Prescribed naprosyn and norco for pain   The patient appears reasonably screened and/or stabilized for discharge and I doubt any other medical condition or other [facility] requiring further screening, evaluation, or treatment in the ED at this time prior to discharge.      Kou Gucciardo L. Deasiah Hagberg, PA-C 10/26/12 0106

## 2012-10-25 NOTE — ED Notes (Signed)
Pt reporting pain in left knee.  States she has had intermittent knee pain for several years, worse over the past 2 days. Pt denies relief from OTC medications, and compression wrap.

## 2012-10-26 MED ORDER — HYDROCODONE-ACETAMINOPHEN 5-325 MG PO TABS: ORAL_TABLET | ORAL | Status: DC

## 2012-10-26 MED ORDER — NAPROXEN 500 MG PO TABS: 500.0000 mg | ORAL_TABLET | Freq: Two times a day (BID) | ORAL | Status: DC

## 2012-10-26 NOTE — ED Provider Notes (Signed)
Medical screening examination/treatment/procedure(s) were performed by non-physician practitioner and as supervising physician I was immediately available for consultation/collaboration.  Nicoletta Dress. Colon Branch, MD 10/26/12 0630

## 2013-01-02 ENCOUNTER — Encounter (HOSPITAL_COMMUNITY): Payer: Self-pay | Admitting: *Deleted

## 2013-01-02 ENCOUNTER — Emergency Department (HOSPITAL_COMMUNITY)
Admission: EM | Admit: 2013-01-02 | Discharge: 2013-01-02 | Disposition: A | Discharge status: Home / Self Care | Payer: Self-pay | Attending: Emergency Medicine | Admitting: Emergency Medicine

## 2013-01-02 DIAGNOSIS — I10 Essential (primary) hypertension: Secondary | ICD-10-CM | POA: Insufficient documentation

## 2013-01-02 DIAGNOSIS — S239XXA Sprain of unspecified parts of thorax, initial encounter: Secondary | ICD-10-CM | POA: Insufficient documentation

## 2013-01-02 DIAGNOSIS — E119 Type 2 diabetes mellitus without complications: Secondary | ICD-10-CM | POA: Insufficient documentation

## 2013-01-02 DIAGNOSIS — F17200 Nicotine dependence, unspecified, uncomplicated: Secondary | ICD-10-CM | POA: Insufficient documentation

## 2013-01-02 DIAGNOSIS — Y9289 Other specified places as the place of occurrence of the external cause: Secondary | ICD-10-CM | POA: Insufficient documentation

## 2013-01-02 DIAGNOSIS — Y9389 Activity, other specified: Secondary | ICD-10-CM | POA: Insufficient documentation

## 2013-01-02 DIAGNOSIS — S29012A Strain of muscle and tendon of back wall of thorax, initial encounter: Secondary | ICD-10-CM

## 2013-01-02 DIAGNOSIS — X500XXA Overexertion from strenuous movement or load, initial encounter: Secondary | ICD-10-CM | POA: Insufficient documentation

## 2013-01-02 MED ORDER — CYCLOBENZAPRINE HCL 10 MG PO TABS: 10.0000 mg | ORAL_TABLET | Freq: Two times a day (BID) | ORAL | Status: DC | PRN

## 2013-01-02 MED ORDER — HYDROCODONE-ACETAMINOPHEN 5-325 MG PO TABS: 1.0000 | ORAL_TABLET | ORAL | Status: DC | PRN

## 2013-01-02 MED ORDER — HYDROCODONE-ACETAMINOPHEN 5-325 MG PO TABS
1.0000 | ORAL_TABLET | Freq: Once | ORAL | Status: AC
  Administered 2013-01-02: 1 via ORAL
  Filled 2013-01-02: qty 1

## 2013-01-02 MED ORDER — IBUPROFEN 600 MG PO TABS: 600.0000 mg | ORAL_TABLET | Freq: Four times a day (QID) | ORAL | Status: DC | PRN

## 2013-01-02 MED ORDER — CYCLOBENZAPRINE HCL 10 MG PO TABS
10.0000 mg | ORAL_TABLET | Freq: Once | ORAL | Status: AC
  Administered 2013-01-02: 10 mg via ORAL
  Filled 2013-01-02: qty 1

## 2013-01-02 NOTE — ED Notes (Signed)
Back pain with "muscle spasms"  After helping move furniture.  Alert, NAD at present

## 2013-01-02 NOTE — ED Notes (Signed)
Pt states pulled a muscle in her middle back moving furniture yesterday.

## 2013-01-02 NOTE — ED Provider Notes (Signed)
History    CSN: [id number] Arrival date & time 01/02/13  2048  First MD Initiated Contact with Patient 01/02/13 2203     Chief Complaint  Patient presents with  . Back Pain   (Consider location/radiation/quality/duration/timing/severity/associated sxs/prior Treatment) Patient is a 42 y.o. female presenting with back pain. The history is provided by the patient.  Back Pain Location:  Thoracic spine Quality:  Burning and shooting Radiates to:  Does not radiate Pain severity:  Moderate Pain is:  Same all the time Onset quality:  Gradual Duration:  1 day Timing:  Constant Progression:  Unchanged Chronicity:  New Context comment:  Moving furniture Ineffective treatments: tylenol. Associated symptoms: no abdominal pain, no chest pain, no dysuria, no fever and no headaches    Alyssa Morales is a 42 y.o. female who presents to the ED with back pain after moving furniture yesterday. The pain is located in the upper back. She denies loss of control of bladder or bowels. No UTI symptoms.  Past Medical History  Diagnosis Date  . Diabetes mellitus   . Hypertension    Past Surgical History  Procedure Laterality Date  . Cholecystectomy    . Cesarean section     History reviewed. No pertinent family history. History  Substance Use Topics  . Smoking status: Current Every Day Smoker -- 1.50 packs/day for 20 years    Types: Cigarettes  . Smokeless tobacco: Never Used  . Alcohol Use: Yes     Comment: once a month   OB History   Grav Para Term Preterm Abortions TAB SAB Ect Mult Living   3 3 3       3      Review of Systems  Constitutional: Negative for fever and chills.  HENT: Negative for neck pain.   Respiratory: Negative for chest tightness.   Cardiovascular: Negative for chest pain.  Gastrointestinal: Negative for nausea, vomiting and abdominal pain.  Genitourinary: Negative for dysuria, urgency and frequency.  Musculoskeletal: Positive for back pain.  Skin: Negative for  wound.  Neurological: Negative for headaches.  Psychiatric/Behavioral: The patient is not nervous/anxious.     Allergies  Review of patient's allergies indicates no known allergies.  Home Medications   Current Outpatient Rx  Name  Route  Sig  Dispense  Refill  . acetaminophen (TYLENOL) 500 MG tablet   Oral   Take 1,000 mg by mouth every 6 (six) hours as needed for pain.          BP 146/86  Pulse 80  Temp(Src) 98.5 F (36.9 C) (Oral)  Resp 18  Ht 5\' 4"  (1.626 m)  Wt 208 lb (94.348 kg)  BMI 35.69 kg/m2  SpO2 98%  LMP 12/29/2012 Physical Exam  Nursing note and vitals reviewed. Constitutional: She is oriented to person, place, and time. She appears well-developed and well-nourished.  HENT:  Head: Normocephalic.  Eyes: EOM are normal.  Neck: Neck supple.  Cardiovascular: Normal rate and regular rhythm.   Pulmonary/Chest: Effort normal and breath sounds normal.  Abdominal: Soft. There is no tenderness.  Musculoskeletal:       Thoracic back: She exhibits decreased range of motion, tenderness and spasm. She exhibits no deformity and normal pulse.       Back:  Neurological: She is alert and oriented to person, place, and time. She has normal strength and normal reflexes. No cranial nerve deficit or sensory deficit. Gait normal.  Pedal pulses strong and equal bilateral.  Skin: Skin is warm and dry.  Psychiatric: She has a normal mood and affect. Her behavior is normal.    ED Course  Procedures  MDM  42 y.o. female with thoracic strain. Will treat with muscle relaxant and pain management. She will follow up with her PCP or return here as needed. Patient stable for discharge home without any immediate complications.   Discussed with the patient clinical findings and plan of care and all questioned fully answered.   Medication List    TAKE these medications       cyclobenzaprine 10 MG tablet  Commonly known as:  FLEXERIL  Take 1 tablet (10 mg total) by mouth 2 (two)  times daily as needed for muscle spasms.     HYDROcodone-acetaminophen 5-325 MG per tablet  Commonly known as:  NORCO/VICODIN  Take 1 tablet by mouth every 4 (four) hours as needed.     ibuprofen 600 MG tablet  Commonly known as:  ADVIL,MOTRIN  Take 1 tablet (600 mg total) by mouth every 6 (six) hours as needed for pain.      ASK your doctor about these medications       acetaminophen 500 MG tablet  Commonly known as:  TYLENOL  Take 1,000 mg by mouth every 6 (six) hours as needed for pain.         [facility] Orlene Och, [place] 01/04/13 1438

## 2013-01-04 NOTE — ED Provider Notes (Signed)
Medical screening examination/treatment/procedure(s) were performed by non-physician practitioner and as supervising physician I was immediately available for consultation/collaboration. Devoria Albe, MD, Armando Gang   Ward Givens, MD 01/04/13 [phone]

## 2013-02-21 ENCOUNTER — Encounter (HOSPITAL_COMMUNITY): Payer: Self-pay

## 2013-02-21 ENCOUNTER — Emergency Department (HOSPITAL_COMMUNITY)
Admission: EM | Admit: 2013-02-21 | Discharge: 2013-02-21 | Disposition: A | Discharge status: Home / Self Care | Payer: Self-pay | Attending: Emergency Medicine | Admitting: Emergency Medicine

## 2013-02-21 DIAGNOSIS — S90562A Insect bite (nonvenomous), left ankle, initial encounter: Secondary | ICD-10-CM

## 2013-02-21 DIAGNOSIS — Y939 Activity, unspecified: Secondary | ICD-10-CM | POA: Insufficient documentation

## 2013-02-21 DIAGNOSIS — F17200 Nicotine dependence, unspecified, uncomplicated: Secondary | ICD-10-CM | POA: Insufficient documentation

## 2013-02-21 DIAGNOSIS — I10 Essential (primary) hypertension: Secondary | ICD-10-CM | POA: Insufficient documentation

## 2013-02-21 DIAGNOSIS — E119 Type 2 diabetes mellitus without complications: Secondary | ICD-10-CM | POA: Insufficient documentation

## 2013-02-21 DIAGNOSIS — IMO0001 Reserved for inherently not codable concepts without codable children: Secondary | ICD-10-CM | POA: Insufficient documentation

## 2013-02-21 DIAGNOSIS — Y929 Unspecified place or not applicable: Secondary | ICD-10-CM | POA: Insufficient documentation

## 2013-02-21 DIAGNOSIS — W57XXXA Bitten or stung by nonvenomous insect and other nonvenomous arthropods, initial encounter: Secondary | ICD-10-CM | POA: Insufficient documentation

## 2013-02-21 DIAGNOSIS — S90569A Insect bite (nonvenomous), unspecified ankle, initial encounter: Secondary | ICD-10-CM | POA: Insufficient documentation

## 2013-02-21 MED ORDER — TRAMADOL HCL 50 MG PO TABS
50.0000 mg | ORAL_TABLET | Freq: Once | ORAL | Status: AC
  Administered 2013-02-21: 50 mg via ORAL
  Filled 2013-02-21: qty 1

## 2013-02-21 MED ORDER — TRAMADOL HCL 50 MG PO TABS: 50.0000 mg | ORAL_TABLET | Freq: Four times a day (QID) | ORAL | Status: DC | PRN

## 2013-02-21 MED ORDER — DIPHENHYDRAMINE HCL 25 MG PO CAPS
50.0000 mg | ORAL_CAPSULE | Freq: Once | ORAL | Status: AC
  Administered 2013-02-21: 50 mg via ORAL
  Filled 2013-02-21: qty 2

## 2013-02-21 NOTE — ED Notes (Signed)
Pt says she was bitten or stung by something 11 am when mowing, Sl pink area to lt lower leg.  Has taken benadryl, cortisone creme., baking soda paste,  Tylenol, without relief.

## 2013-02-21 NOTE — ED Notes (Signed)
I got bit or stung on my left lower left, near my ankle and there is pain and burning around the area per pt. Happened around 1100 this morning.

## 2013-02-23 NOTE — ED Provider Notes (Signed)
CSN: [id number]     Arrival date & time 02/21/13  2022 History     First MD Initiated Contact with Patient 02/21/13 2028     Chief Complaint  Patient presents with  . Insect Bite   (Consider location/radiation/quality/duration/timing/severity/associated sxs/prior Treatment) HPI Comments: Alyssa Morales is a 42 y.o. Female presenting with pain at her left lower leg after being stung by an insect,  Possibly a yellow jacket when mowing her lawn this morning.  She has taken benadryl,  Has applied otc cortisone cream, tried tylenol and even mixed a baking soda paste, none of which relieved her pain.  Her pain is throbbing, constant, but worse with palpation but does not radiate.  There is no drainage from the wound site and she denies having to remove a stinger from the sting site. She denies fevers or chills, has had no cough, sob, wheezing,  Denies swelling.  She is a diabetic which is controlled through diet.     The history is provided by the patient.    Past Medical History  Diagnosis Date  . Diabetes mellitus   . Hypertension    Past Surgical History  Procedure Laterality Date  . Cholecystectomy    . Cesarean section     No family history on file. History  Substance Use Topics  . Smoking status: Current Every Day Smoker -- 1.50 packs/day for 20 years    Types: Cigarettes  . Smokeless tobacco: Never Used  . Alcohol Use: Yes     Comment: once a month   OB History   Grav Para Term Preterm Abortions TAB SAB Ect Mult Living   3 3 3       3      Review of Systems  Constitutional: Negative for fever and chills.  HENT: Negative for facial swelling.   Respiratory: Negative for shortness of breath and wheezing.   Skin: Positive for color change and wound.  Neurological: Negative for numbness.    Allergies  Review of patient's allergies indicates no known allergies.  Home Medications   Current Outpatient Rx  Name  Route  Sig  Dispense  Refill  . acetaminophen (TYLENOL)  500 MG tablet   Oral   Take 1,000 mg by mouth every 6 (six) hours as needed for pain.         . cyclobenzaprine (FLEXERIL) 10 MG tablet   Oral   Take 1 tablet (10 mg total) by mouth 2 (two) times daily as needed for muscle spasms.   20 tablet   0   . HYDROcodone-acetaminophen (NORCO/VICODIN) 5-325 MG per tablet   Oral   Take 1 tablet by mouth every 4 (four) hours as needed.   15 tablet   0   . ibuprofen (ADVIL,MOTRIN) 600 MG tablet   Oral   Take 1 tablet (600 mg total) by mouth every 6 (six) hours as needed for pain.   30 tablet   0   . traMADol (ULTRAM) 50 MG tablet   Oral   Take 1 tablet (50 mg total) by mouth every 6 (six) hours as needed for pain.   15 tablet   0    BP 136/95  Pulse 88  Temp(Src) 98.4 F (36.9 C) (Oral)  Resp 20  Ht 5\' 4"  (1.626 m)  Wt 205 lb (92.987 kg)  BMI 35.17 kg/m2  SpO2 99%  LMP 01/21/2013 Physical Exam  Constitutional: She appears well-developed and well-nourished. No distress.  HENT:  Head: Normocephalic.  Neck: Neck  supple.  Cardiovascular: Normal rate.   Pulmonary/Chest: Effort normal. She has no wheezes.  Musculoskeletal: Normal range of motion. She exhibits no edema.  Skin:  Small indurated lesion left lower lateral ankle, slight 1 cm erythema surrounding a puncture site.  There is no red streaking,  No edema, no drainage.    ED Course   Procedures (including critical care time)  Labs Reviewed - No data to display No results found. 1. Insect bite of left ankle with local reaction     MDM  Localize insect bite occuring today with persistent pain.  No exam findings suggesting infection, rather this appears to be localized reaction. Pt was encouraged to continue taking benadryl, add ibuprofen, ice and elevation.  Tramadol prescribed for additional pain relief.  No signs of advancing allergic or anaphylactic reaction.  The patient appears reasonably screened and/or stabilized for discharge and I doubt any other medical  condition or other [facility] requiring further screening, evaluation, or treatment in the ED at this time prior to discharge.   Burgess Amor, PA-C 02/23/13 1242

## 2013-02-23 NOTE — ED Provider Notes (Signed)
Medical screening examination/treatment/procedure(s) were performed by non-physician practitioner and as supervising physician I was immediately available for consultation/collaboration.  Jannifer Fischler, MD 02/23/13 1517 

## 2013-04-09 ENCOUNTER — Other Ambulatory Visit (HOSPITAL_COMMUNITY): Payer: Self-pay | Admitting: Physician Assistant

## 2013-04-09 DIAGNOSIS — Z139 Encounter for screening, unspecified: Secondary | ICD-10-CM

## 2013-04-15 ENCOUNTER — Ambulatory Visit (HOSPITAL_COMMUNITY)
Admission: RE | Admit: 2013-04-15 | Discharge: 2013-04-15 | Disposition: A | Discharge status: Home / Self Care | Payer: Self-pay | Source: Ambulatory Visit | Attending: Physician Assistant | Admitting: Physician Assistant

## 2013-04-15 DIAGNOSIS — Z139 Encounter for screening, unspecified: Secondary | ICD-10-CM

## 2013-10-16 ENCOUNTER — Telehealth (HOSPITAL_COMMUNITY): Payer: Self-pay | Admitting: Dietician

## 2013-10-16 NOTE — Telephone Encounter (Signed)
Pt was a no-show for appointment (DM Class) scheduled for 10/16/2013 at 1730.

## 2014-03-21 ENCOUNTER — Emergency Department (HOSPITAL_COMMUNITY)
Admission: EM | Admit: 2014-03-21 | Discharge: 2014-03-22 | Disposition: A | Discharge status: Home / Self Care | Payer: Self-pay | Attending: Emergency Medicine | Admitting: Emergency Medicine

## 2014-03-21 ENCOUNTER — Encounter (HOSPITAL_COMMUNITY): Payer: Self-pay | Admitting: Emergency Medicine

## 2014-03-21 DIAGNOSIS — I10 Essential (primary) hypertension: Secondary | ICD-10-CM | POA: Insufficient documentation

## 2014-03-21 DIAGNOSIS — F17200 Nicotine dependence, unspecified, uncomplicated: Secondary | ICD-10-CM | POA: Insufficient documentation

## 2014-03-21 DIAGNOSIS — K5289 Other specified noninfective gastroenteritis and colitis: Secondary | ICD-10-CM | POA: Insufficient documentation

## 2014-03-21 DIAGNOSIS — R109 Unspecified abdominal pain: Secondary | ICD-10-CM | POA: Insufficient documentation

## 2014-03-21 DIAGNOSIS — K529 Noninfective gastroenteritis and colitis, unspecified: Secondary | ICD-10-CM

## 2014-03-21 DIAGNOSIS — E119 Type 2 diabetes mellitus without complications: Secondary | ICD-10-CM | POA: Insufficient documentation

## 2014-03-21 LAB — BASIC METABOLIC PANEL
ANION GAP: 13 (ref 5–15)
BUN: 13 mg/dL (ref 6–23)
CALCIUM: 9.2 mg/dL (ref 8.4–10.5)
CO2: 24 meq/L (ref 19–32)
Chloride: 100 mEq/L (ref 96–112)
Creatinine, Ser: 0.66 mg/dL (ref 0.50–1.10)
GFR calc Af Amer: >90 mL/min (ref >90)
GLUCOSE: 199 mg/dL — AB (ref 70–99)
POTASSIUM: 3.7 meq/L (ref 3.7–5.3)
SODIUM: 137 meq/L (ref 137–147)

## 2014-03-21 LAB — CBC WITH DIFFERENTIAL/PLATELET
BASOS ABS: 0.1 10*3/uL (ref 0.0–0.1)
Basophils Relative: 1 % (ref 0–1)
EOS ABS: 0.3 10*3/uL (ref 0.0–0.7)
EOS PCT: 3 % (ref 0–5)
HCT: 43.5 % (ref 36.0–46.0)
Hemoglobin: 15.5 g/dL — ABNORMAL HIGH (ref 12.0–15.0)
LYMPHS ABS: 3 10*3/uL (ref 0.7–4.0)
LYMPHS PCT: 28 % (ref 12–46)
MCH: 31.8 pg (ref 26.0–34.0)
MCHC: 35.6 g/dL (ref 30.0–36.0)
MCV: 89.1 fL (ref 78.0–100.0)
Monocytes Absolute: 0.8 10*3/uL (ref 0.1–1.0)
Monocytes Relative: 8 % (ref 3–12)
NEUTROS PCT: 60 % (ref 43–77)
Neutro Abs: 6.6 10*3/uL (ref 1.7–7.7)
PLATELETS: 317 10*3/uL (ref 150–400)
RBC: 4.88 MIL/uL (ref 3.87–5.11)
RDW: 12.8 % (ref 11.5–15.5)
WBC: 10.7 10*3/uL — AB (ref 4.0–10.5)

## 2014-03-21 MED ORDER — KETOROLAC TROMETHAMINE 30 MG/ML IJ SOLN
30.0000 mg | Freq: Once | INTRAMUSCULAR | Status: AC
  Administered 2014-03-21: 30 mg via INTRAVENOUS
  Filled 2014-03-21: qty 1

## 2014-03-21 MED ORDER — ONDANSETRON HCL 4 MG/2ML IJ SOLN
4.0000 mg | Freq: Once | INTRAMUSCULAR | Status: AC
  Administered 2014-03-21: 4 mg via INTRAVENOUS
  Filled 2014-03-21: qty 2

## 2014-03-21 NOTE — ED Notes (Signed)
Pt called me to bathroom and had small runny BM; BM obtained in a specimen cup.

## 2014-03-21 NOTE — ED Provider Notes (Signed)
CSN: [id number]     Arrival date & time 03/21/14  2227 History   First MD Initiated Contact with Patient 03/21/14 2258    This chart was scribed for Veryl Speak, MD by Terressa Koyanagi, ED Scribe. This patient was seen in room APA12/APA12 and the patient's care was started at 11:17 PM.  Chief Complaint  Patient presents with  . Abdominal Pain   The history is provided by the patient. No language interpreter was used.   HPI Comments: PCP: No PCP Per Patient  KIRSI HUGH is a 43 y.o. female, with medical Hx noted below, who presents to the Emergency Department complaining of diarrhea and suprapubic abd pain onset upon waking this morning. Pt describes her abd pain as a spasm sensation and rates it a 6 out of 10. Pt denies having a recent colonoscopy; nausea; vomiting. Pt suspects she may have food poisoning as her brother and her ate beef that was in a broken refrigerator. Pt reports her brother is having similar Sx.   Pt notes that she is noncompliant with her meds for DM and HTN.    Past Medical History  Diagnosis Date  . Diabetes mellitus   . Hypertension    Past Surgical History  Procedure Laterality Date  . Cholecystectomy    . Cesarean section     No family history on file. History  Substance Use Topics  . Smoking status: Current Every Day Smoker -- 1.50 packs/day for 20 years    Types: Cigarettes  . Smokeless tobacco: Never Used  . Alcohol Use: Yes     Comment: once a month   OB History   Grav Para Term Preterm Abortions TAB SAB Ect Mult Living   3 3 3       3      Review of Systems  Constitutional: Negative for fever and chills.  Gastrointestinal: Positive for abdominal pain and diarrhea. Negative for nausea and vomiting.  Psychiatric/Behavioral: Negative for confusion.  All other systems reviewed and are negative.     Allergies  Review of patient's allergies indicates no known allergies.  Home Medications   Prior to Admission medications   Medication Sig  Start Date End Date Taking? Authorizing Provider  acetaminophen (TYLENOL) 500 MG tablet Take 1,000 mg by mouth every 6 (six) hours as needed for pain.   Yes Historical Provider, MD   Triage Vitals: BP 149/86  Pulse 98  Temp(Src) 98.4 F (36.9 C) (Oral)  Resp 20  Ht 5\' 4"  (1.626 m)  Wt 202 lb (91.627 kg)  BMI 34.66 kg/m2  SpO2 98%  LMP 03/16/2014 Physical Exam  Nursing note and vitals reviewed. Constitutional: She is oriented to person, place, and time. She appears well-developed and well-nourished. No distress.  HENT:  Head: Normocephalic and atraumatic.  Eyes: Conjunctivae and EOM are normal.  Neck: Neck supple. No tracheal deviation present.  Cardiovascular: Normal rate.   Pulmonary/Chest: Effort normal. No respiratory distress.  Abdominal: There is tenderness (mild suprapubic tenderness to palpation ). There is no rebound and no guarding.  Musculoskeletal: Normal range of motion.  Neurological: She is alert and oriented to person, place, and time.  Skin: Skin is warm and dry.  Psychiatric: She has a normal mood and affect. Her behavior is normal.    ED Course  Procedures (including critical care time) DIAGNOSTIC STUDIES: Oxygen Saturation is 98% on RA, nl by my interpretation.    COORDINATION OF CARE: 11:21 PM-Discussed treatment plan which includes meds and labs with  pt at bedside and pt agreed to plan.   Labs Review Labs Reviewed  CBC WITH DIFFERENTIAL  BASIC METABOLIC PANEL    Imaging Review No results found.   EKG Interpretation None      MDM   Final diagnoses:  None    Patient presents with lower abdominal discomfort and diarrhea which has been occurring all day today. She is concerned she may have eaten meat that was allowed to thaw and was then cooked.  She noticed a little bit of blood in her last episode and comes for evaluation.. Her abdomen reveals mild tenderness in the suprapubic region with no rebound and no guarding. She has no right lower  quadrant tenderness to palpation and I doubt appendicitis. My suspicions are this could potentially be diverticulitis as she does have a mild leukocytosis. There is also the possibility this could be a viral gastroenteritis. She is feeling better with fluids, Toradol, and Zofran. She will be discharged to home with Flagyl. Stool cultures will be pending.  I personally performed the services described in this documentation, which was scribed in my presence. The recorded information has been reviewed and is accurate.      Veryl Speak, MD 03/22/14 [phone]

## 2014-03-21 NOTE — ED Notes (Signed)
Pt c/o abdominal spasms and diarrhea. Pt believes it could be due to food poisoning after eating some beef that was cooked after the refrigerator broke and the meat thawed.

## 2014-03-22 LAB — CLOSTRIDIUM DIFFICILE BY PCR: Toxigenic C. Difficile by PCR: NEGATIVE

## 2014-03-22 MED ORDER — METRONIDAZOLE 500 MG PO TABS
500.0000 mg | ORAL_TABLET | Freq: Once | ORAL | Status: AC
  Administered 2014-03-22: 500 mg via ORAL
  Filled 2014-03-22: qty 1

## 2014-03-22 MED ORDER — METRONIDAZOLE 500 MG PO TABS: 500.0000 mg | ORAL_TABLET | Freq: Three times a day (TID) | ORAL | Status: DC

## 2014-03-22 NOTE — Discharge Instructions (Signed)
Flagyl as prescribed.  We will call you if your stool cultures indicate you require further treatment.  Return in the meantime if you develop increased abdominal pain, bloody stool, high fever, or other new and concerning symptoms.    Gastroenteritis Viral gastroenteritis is also known as stomach flu. This condition affects the stomach and intestinal tract. It can cause sudden diarrhea and vomiting. The illness typically lasts 3 to 8 days. Most people develop an immune response that eventually gets rid of the virus. While this natural response develops, the virus can make you quite ill. CAUSES  Many different viruses can cause gastroenteritis, such as rotavirus or noroviruses. You can catch one of these viruses by consuming contaminated food or water. You may also catch a virus by sharing utensils or other personal items with an infected person or by touching a contaminated surface. SYMPTOMS  The most common symptoms are diarrhea and vomiting. These problems can cause a severe loss of body fluids (dehydration) and a body salt (electrolyte) imbalance. Other symptoms may include:  Fever.  Headache.  Fatigue.  Abdominal pain. DIAGNOSIS  Your caregiver can usually diagnose viral gastroenteritis based on your symptoms and a physical exam. A stool sample may also be taken to test for the presence of viruses or other infections. TREATMENT  This illness typically goes away on its own. Treatments are aimed at rehydration. The most serious cases of viral gastroenteritis involve vomiting so severely that you are not able to keep fluids down. In these cases, fluids must be given through an intravenous line (IV). HOME CARE INSTRUCTIONS   Drink enough fluids to keep your urine clear or pale yellow. Drink small amounts of fluids frequently and increase the amounts as tolerated.  Ask your caregiver for specific rehydration instructions.  Avoid:  Foods high in sugar.  Alcohol.  Carbonated  drinks.  Tobacco.  Juice.  Caffeine drinks.  Extremely hot or cold fluids.  Fatty, greasy foods.  Too much intake of anything at one time.  Dairy products until 24 to 48 hours after diarrhea stops.  You may consume probiotics. Probiotics are active cultures of beneficial bacteria. They may lessen the amount and number of diarrheal stools in adults. Probiotics can be found in yogurt with active cultures and in supplements.  Wash your hands well to avoid spreading the virus.  Only take over-the-counter or prescription medicines for pain, discomfort, or fever as directed by your caregiver. Do not give aspirin to children. Antidiarrheal medicines are not recommended.  Ask your caregiver if you should continue to take your regular prescribed and over-the-counter medicines.  Keep all follow-up appointments as directed by your caregiver. SEEK IMMEDIATE MEDICAL CARE IF:   You are unable to keep fluids down.  You do not urinate at least once every 6 to 8 hours.  You develop shortness of breath.  You notice blood in your stool or vomit. This may look like coffee grounds.  You have abdominal pain that increases or is concentrated in one small area (localized).  You have persistent vomiting or diarrhea.  You have a fever.  The patient is a child younger than 3 months, and he or she has a fever.  The patient is a child older than 3 months, and he or she has a fever and persistent symptoms.  The patient is a child older than 3 months, and he or she has a fever and symptoms suddenly get worse.  The patient is a baby, and he or she has no  tears when crying. MAKE SURE YOU:   Understand these instructions.  Will watch your condition.  Will get help right away if you are not doing well or get worse. Document Released: 06/26/2005 Document Revised: 09/18/2011 Document Reviewed: 04/12/2011 [facility] Patient Information 2015 [place], [place]. This information is not intended to replace  advice given to you by your health care provider. Make sure you discuss any questions you have with your health care provider.

## 2014-03-26 LAB — STOOL CULTURE

## 2014-05-11 ENCOUNTER — Encounter (HOSPITAL_COMMUNITY): Payer: Self-pay | Admitting: Emergency Medicine

## 2014-11-01 ENCOUNTER — Inpatient Hospital Stay (HOSPITAL_COMMUNITY)
Admission: EM | Admit: 2014-11-01 | Discharge: 2014-11-02 | DRG: 281 | Disposition: A | Discharge status: Home / Self Care | Payer: Self-pay | Attending: Cardiology | Admitting: Cardiology

## 2014-11-01 ENCOUNTER — Encounter (HOSPITAL_COMMUNITY): Payer: Self-pay | Admitting: *Deleted

## 2014-11-01 ENCOUNTER — Emergency Department (HOSPITAL_COMMUNITY): Payer: Self-pay

## 2014-11-01 DIAGNOSIS — E1165 Type 2 diabetes mellitus with hyperglycemia: Secondary | ICD-10-CM | POA: Diagnosis present

## 2014-11-01 DIAGNOSIS — I10 Essential (primary) hypertension: Secondary | ICD-10-CM | POA: Diagnosis present

## 2014-11-01 DIAGNOSIS — I214 Non-ST elevation (NSTEMI) myocardial infarction: Principal | ICD-10-CM | POA: Diagnosis present

## 2014-11-01 DIAGNOSIS — E669 Obesity, unspecified: Secondary | ICD-10-CM | POA: Diagnosis present

## 2014-11-01 DIAGNOSIS — F17210 Nicotine dependence, cigarettes, uncomplicated: Secondary | ICD-10-CM | POA: Diagnosis present

## 2014-11-01 DIAGNOSIS — R079 Chest pain, unspecified: Secondary | ICD-10-CM

## 2014-11-01 DIAGNOSIS — D72829 Elevated white blood cell count, unspecified: Secondary | ICD-10-CM | POA: Diagnosis present

## 2014-11-01 DIAGNOSIS — Z6833 Body mass index (BMI) 33.0-33.9, adult: Secondary | ICD-10-CM

## 2014-11-01 DIAGNOSIS — Z9049 Acquired absence of other specified parts of digestive tract: Secondary | ICD-10-CM | POA: Diagnosis present

## 2014-11-01 DIAGNOSIS — Z9119 Patient's noncompliance with other medical treatment and regimen: Secondary | ICD-10-CM | POA: Diagnosis present

## 2014-11-01 DIAGNOSIS — E119 Type 2 diabetes mellitus without complications: Secondary | ICD-10-CM

## 2014-11-01 DIAGNOSIS — E781 Pure hyperglyceridemia: Secondary | ICD-10-CM | POA: Diagnosis present

## 2014-11-01 DIAGNOSIS — E871 Hypo-osmolality and hyponatremia: Secondary | ICD-10-CM | POA: Diagnosis present

## 2014-11-01 DIAGNOSIS — I25110 Atherosclerotic heart disease of native coronary artery with unstable angina pectoris: Secondary | ICD-10-CM | POA: Diagnosis present

## 2014-11-01 DIAGNOSIS — Z720 Tobacco use: Secondary | ICD-10-CM | POA: Diagnosis present

## 2014-11-01 HISTORY — DX: Obesity, unspecified: E66.9

## 2014-11-01 HISTORY — DX: Pure hyperglyceridemia: E78.1

## 2014-11-01 HISTORY — DX: Hyperlipidemia, unspecified: E78.5

## 2014-11-01 HISTORY — DX: Tobacco use: Z72.0

## 2014-11-01 HISTORY — DX: Atherosclerotic heart disease of native coronary artery without angina pectoris: I25.10

## 2014-11-01 HISTORY — DX: Type 2 diabetes mellitus with unspecified complications: E11.8

## 2014-11-01 LAB — TROPONIN I
TROPONIN I: 0.06 ng/mL — AB (ref <0.031)
TROPONIN I: 0.06 ng/mL — AB (ref <0.031)
Troponin I: <0.03 ng/mL (ref <0.031)
Troponin I: 0.04 ng/mL — ABNORMAL HIGH (ref <0.031)
Troponin I: 0.08 ng/mL — ABNORMAL HIGH (ref <0.031)
Troponin I: 0.09 ng/mL — ABNORMAL HIGH (ref <0.031)

## 2014-11-01 LAB — CBC WITH DIFFERENTIAL/PLATELET
Basophils Absolute: 0.1 10*3/uL (ref 0.0–0.1)
Basophils Relative: 0 % (ref 0–1)
Eosinophils Absolute: 0.1 10*3/uL (ref 0.0–0.7)
Eosinophils Relative: 1 % (ref 0–5)
HEMATOCRIT: 41.2 % (ref 36.0–46.0)
HEMOGLOBIN: 14.1 g/dL (ref 12.0–15.0)
LYMPHS PCT: 13 % (ref 12–46)
Lymphs Abs: 2.9 10*3/uL (ref 0.7–4.0)
MCH: 30.7 pg (ref 26.0–34.0)
MCHC: 34.2 g/dL (ref 30.0–36.0)
MCV: 89.6 fL (ref 78.0–100.0)
MONO ABS: 1.2 10*3/uL — AB (ref 0.1–1.0)
Monocytes Relative: 6 % (ref 3–12)
NEUTROS ABS: 18 10*3/uL — AB (ref 1.7–7.7)
Neutrophils Relative %: 80 % — ABNORMAL HIGH (ref 43–77)
Platelets: 336 10*3/uL (ref 150–400)
RBC: 4.6 MIL/uL (ref 3.87–5.11)
RDW: 12.7 % (ref 11.5–15.5)
WBC: 22.3 10*3/uL — ABNORMAL HIGH (ref 4.0–10.5)

## 2014-11-01 LAB — GLUCOSE, CAPILLARY
GLUCOSE-CAPILLARY: 228 mg/dL — AB (ref 70–99)
Glucose-Capillary: 181 mg/dL — ABNORMAL HIGH (ref 70–99)
Glucose-Capillary: 206 mg/dL — ABNORMAL HIGH (ref 70–99)

## 2014-11-01 LAB — COMPREHENSIVE METABOLIC PANEL
ALT: 15 U/L (ref 0–35)
ANION GAP: 8 (ref 5–15)
AST: 13 U/L (ref 0–37)
Albumin: 4 g/dL (ref 3.5–5.2)
Alkaline Phosphatase: 74 U/L (ref 39–117)
BUN: 17 mg/dL (ref 6–23)
CO2: 22 mmol/L (ref 19–32)
Calcium: 9 mg/dL (ref 8.4–10.5)
Chloride: 103 mmol/L (ref 96–112)
Creatinine, Ser: 0.66 mg/dL (ref 0.50–1.10)
GFR calc non Af Amer: >90 mL/min (ref >90)
Glucose, Bld: 269 mg/dL — ABNORMAL HIGH (ref 70–99)
POTASSIUM: 3.8 mmol/L (ref 3.5–5.1)
Sodium: 133 mmol/L — ABNORMAL LOW (ref 135–145)
TOTAL PROTEIN: 7.5 g/dL (ref 6.0–8.3)
Total Bilirubin: 0.4 mg/dL (ref 0.3–1.2)

## 2014-11-01 LAB — HEPARIN LEVEL (UNFRACTIONATED)
HEPARIN UNFRACTIONATED: 0.15 [IU]/mL — AB (ref 0.30–0.70)
Heparin Unfractionated: 0.11 IU/mL — ABNORMAL LOW (ref 0.30–0.70)

## 2014-11-01 LAB — BRAIN NATRIURETIC PEPTIDE: B NATRIURETIC PEPTIDE 5: 13 pg/mL (ref 0.0–100.0)

## 2014-11-01 LAB — TSH: TSH: 1.501 u[IU]/mL (ref 0.350–4.500)

## 2014-11-01 MED ORDER — HEPARIN BOLUS VIA INFUSION
2000.0000 [IU] | Freq: Once | INTRAVENOUS | Status: AC
  Administered 2014-11-01: 2000 [IU] via INTRAVENOUS
  Filled 2014-11-01: qty 2000

## 2014-11-01 MED ORDER — METOPROLOL TARTRATE 12.5 MG HALF TABLET
12.5000 mg | ORAL_TABLET | Freq: Two times a day (BID) | ORAL | Status: DC
  Administered 2014-11-01 (×2): 12.5 mg via ORAL
  Filled 2014-11-01 (×2): qty 1

## 2014-11-01 MED ORDER — ALBUTEROL SULFATE (2.5 MG/3ML) 0.083% IN NEBU
2.5000 mg | INHALATION_SOLUTION | Freq: Once | RESPIRATORY_TRACT | Status: AC
  Administered 2014-11-01: 2.5 mg via RESPIRATORY_TRACT
  Filled 2014-11-01: qty 3

## 2014-11-01 MED ORDER — HEPARIN BOLUS VIA INFUSION
2000.0000 [IU] | Freq: Once | INTRAVENOUS | Status: AC
Start: 2014-11-01 — End: 2014-11-01
  Administered 2014-11-01: 2000 [IU] via INTRAVENOUS
  Filled 2014-11-01: qty 2000

## 2014-11-01 MED ORDER — NITROGLYCERIN IN D5W 200-5 MCG/ML-% IV SOLN
5.0000 ug/min | Freq: Once | INTRAVENOUS | Status: AC
Start: 2014-11-01 — End: 2014-11-01
  Administered 2014-11-01: 10 ug/min via INTRAVENOUS

## 2014-11-01 MED ORDER — ALBUTEROL SULFATE (2.5 MG/3ML) 0.083% IN NEBU: 5.0000 mg | INHALATION_SOLUTION | Freq: Once | RESPIRATORY_TRACT | Status: DC

## 2014-11-01 MED ORDER — ATORVASTATIN CALCIUM 20 MG PO TABS: 20.0000 mg | ORAL_TABLET | Freq: Every day | ORAL | Status: DC

## 2014-11-01 MED ORDER — NITROGLYCERIN 0.4 MG SL SUBL: 0.4000 mg | SUBLINGUAL_TABLET | SUBLINGUAL | Status: DC | PRN

## 2014-11-01 MED ORDER — NICOTINE 21 MG/24HR TD PT24
21.0000 mg | MEDICATED_PATCH | Freq: Once | TRANSDERMAL | Status: AC
  Administered 2014-11-01: 21 mg via TRANSDERMAL
  Filled 2014-11-01: qty 1

## 2014-11-01 MED ORDER — ACETAMINOPHEN 325 MG PO TABS
ORAL_TABLET | ORAL | Status: AC
  Administered 2014-11-01: 650 mg via ORAL
  Filled 2014-11-01: qty 2

## 2014-11-01 MED ORDER — IPRATROPIUM BROMIDE 0.02 % IN SOLN: 0.5000 mg | Freq: Once | RESPIRATORY_TRACT | Status: DC

## 2014-11-01 MED ORDER — NICOTINE 14 MG/24HR TD PT24
14.0000 mg | MEDICATED_PATCH | Freq: Every day | TRANSDERMAL | Status: DC
Start: 2014-11-01 — End: 2014-11-01

## 2014-11-01 MED ORDER — SODIUM CHLORIDE 0.9 % IV SOLN: 250.0000 mL | INTRAVENOUS | Status: DC | PRN

## 2014-11-01 MED ORDER — ASPIRIN 81 MG PO CHEW
81.0000 mg | CHEWABLE_TABLET | ORAL | Status: AC
  Administered 2014-11-02: 81 mg via ORAL
  Filled 2014-11-01: qty 1

## 2014-11-01 MED ORDER — HEPARIN (PORCINE) IN NACL 100-0.45 UNIT/ML-% IJ SOLN
1100.0000 [IU]/h | INTRAMUSCULAR | Status: DC
  Administered 2014-11-01 (×2): 1100 [IU]/h via INTRAVENOUS
  Filled 2014-11-01: qty 250

## 2014-11-01 MED ORDER — ATORVASTATIN CALCIUM 40 MG PO TABS
40.0000 mg | ORAL_TABLET | Freq: Every day | ORAL | Status: DC
  Administered 2014-11-01: 40 mg via ORAL
  Filled 2014-11-01: qty 1

## 2014-11-01 MED ORDER — IPRATROPIUM-ALBUTEROL 0.5-2.5 (3) MG/3ML IN SOLN
3.0000 mL | Freq: Once | RESPIRATORY_TRACT | Status: AC
  Administered 2014-11-01: 3 mL via RESPIRATORY_TRACT
  Filled 2014-11-01: qty 3

## 2014-11-01 MED ORDER — LORAZEPAM 2 MG/ML IJ SOLN
INTRAMUSCULAR | Status: AC
  Filled 2014-11-01: qty 1

## 2014-11-01 MED ORDER — ACETAMINOPHEN 325 MG PO TABS
650.0000 mg | ORAL_TABLET | Freq: Once | ORAL | Status: AC
  Administered 2014-11-01: 650 mg via ORAL

## 2014-11-01 MED ORDER — ONDANSETRON HCL 4 MG/2ML IJ SOLN: 4.0000 mg | Freq: Four times a day (QID) | INTRAMUSCULAR | Status: DC | PRN

## 2014-11-01 MED ORDER — HEPARIN (PORCINE) IN NACL 100-0.45 UNIT/ML-% IJ SOLN
1500.0000 [IU]/h | INTRAMUSCULAR | Status: DC
  Administered 2014-11-01: 1300 [IU]/h via INTRAVENOUS
  Administered 2014-11-01: 1500 [IU]/h via INTRAVENOUS
  Filled 2014-11-01: qty 250

## 2014-11-01 MED ORDER — INSULIN ASPART 100 UNIT/ML ~~LOC~~ SOLN
0.0000 [IU] | Freq: Three times a day (TID) | SUBCUTANEOUS | Status: DC
  Administered 2014-11-01: 2 [IU] via SUBCUTANEOUS
  Administered 2014-11-01: 3 [IU] via SUBCUTANEOUS

## 2014-11-01 MED ORDER — NITROGLYCERIN 2 % TD OINT
1.0000 [in_us] | TOPICAL_OINTMENT | Freq: Once | TRANSDERMAL | Status: AC
  Administered 2014-11-01: 1 [in_us] via TOPICAL
  Filled 2014-11-01: qty 1

## 2014-11-01 MED ORDER — SODIUM CHLORIDE 0.9 % IJ SOLN: 3.0000 mL | Freq: Two times a day (BID) | INTRAMUSCULAR | Status: DC

## 2014-11-01 MED ORDER — ASPIRIN 81 MG PO CHEW
324.0000 mg | CHEWABLE_TABLET | Freq: Once | ORAL | Status: AC
  Administered 2014-11-01: 324 mg via ORAL
  Filled 2014-11-01: qty 4

## 2014-11-01 MED ORDER — NICOTINE 21 MG/24HR TD PT24
21.0000 mg | MEDICATED_PATCH | Freq: Every day | TRANSDERMAL | Status: DC
  Filled 2014-11-01 (×2): qty 1

## 2014-11-01 MED ORDER — NITROGLYCERIN IN D5W 200-5 MCG/ML-% IV SOLN
5.0000 ug/min | Freq: Once | INTRAVENOUS | Status: AC
  Administered 2014-11-01: 15 ug/min via INTRAVENOUS
  Filled 2014-11-01: qty 250

## 2014-11-01 MED ORDER — ASPIRIN EC 81 MG PO TBEC: 81.0000 mg | DELAYED_RELEASE_TABLET | Freq: Every day | ORAL | Status: DC

## 2014-11-01 MED ORDER — SODIUM CHLORIDE 0.9 % IJ SOLN: 3.0000 mL | INTRAMUSCULAR | Status: DC | PRN

## 2014-11-01 MED ORDER — HEPARIN BOLUS VIA INFUSION
4000.0000 [IU] | Freq: Once | INTRAVENOUS | Status: AC
  Administered 2014-11-01: 4000 [IU] via INTRAVENOUS

## 2014-11-01 MED ORDER — LORAZEPAM 2 MG/ML IJ SOLN
0.5000 mg | Freq: Once | INTRAMUSCULAR | Status: AC
  Administered 2014-11-01: 0.5 mg via INTRAVENOUS

## 2014-11-01 MED ORDER — SODIUM CHLORIDE 0.9 % IV SOLN: 1.0000 mL/kg/h | INTRAVENOUS | Status: DC

## 2014-11-01 MED ORDER — ACETAMINOPHEN 325 MG PO TABS: 650.0000 mg | ORAL_TABLET | ORAL | Status: DC | PRN

## 2014-11-01 MED ORDER — PNEUMOCOCCAL VAC POLYVALENT 25 MCG/0.5ML IJ INJ
0.5000 mL | INJECTION | INTRAMUSCULAR | Status: DC
  Filled 2014-11-01: qty 0.5

## 2014-11-01 NOTE — ED Notes (Signed)
Pt c/o headache with administration of nitro gtt. Tylenol given per pain protocol.

## 2014-11-01 NOTE — ED Notes (Signed)
Pt states Pain is better now that on admission. Nitro gtt started at 57mcg with ns at [facility] to keep line patent.

## 2014-11-01 NOTE — ED Notes (Signed)
Pt c/o mid center, epigastric area chest heaviness that has been intermittent, pain started a few days ago when she was climbing the stairs and would go away with rest, pain is associated with nausea, weakness tonight,

## 2014-11-01 NOTE — ED Provider Notes (Signed)
CSN: [id number]     Arrival date & time 11/01/14  0034 History  This chart was scribed for Rolland Porter, MD by Jeanell Sparrow, ED Scribe. This patient was seen in room APA05/APA05 and the patient's care was started at 12:50 AM.    Chief Complaint  Patient presents with  . Chest Pain   The history is provided by the patient. No language interpreter was used.   HPI Comments: Alyssa Morales is a 44 y.o. female who presents to the Emergency Department complaining of intermittent moderate substernal chest pain that started 2 days ago. She states that the pain started off as an intermittent sensation present only when she was going up 3 flights of stairs twice a day at work and would last for about 5 minutes and went away when she sat down to rest. She reports that today the pain worsened and become intermittent episodes lasting 15-20 minutes off and on all day today and would occur at rest or with exertion.  She describes the pain as a heavy sensation. She states that there are no modifying factors for today's pain. She reports associated weakness in BUE, and mild nausea. She states that she is currently in pain and this episode started at 9 pm tonight and has been constant. She reports that she smokes 1.5 PPD and her father died of a sudden MI at the age of 62. She denies any vomiting, SOB, or unusual diaphoresis. She reports DOE when going up the stairs and admits to hearing wheezing at times.   Patient has a history of hypertension and diabetes and states she has not taken any medication for 3 years because she was without insurance.  PCP none  Past Medical History  Diagnosis Date  . Diabetes mellitus   . Hypertension    Past Surgical History  Procedure Laterality Date  . Cholecystectomy    . Cesarean section     No family history on file. History  Substance Use Topics  . Smoking status: Current Every Day Smoker -- 1.50 packs/day for 20 years    Types: Cigarettes  . Smokeless tobacco: Never  Used  . Alcohol Use: Yes     Comment: once a month   employed  OB History    Gravida Para Term Preterm AB TAB SAB Ectopic Multiple Living   3 3 3       3      Review of Systems  Constitutional: Negative for diaphoresis.  Respiratory: Negative for shortness of breath.   Cardiovascular: Positive for chest pain.  Gastrointestinal: Negative for vomiting.  All other systems reviewed and are negative.   Allergies  Review of patient's allergies indicates no known allergies.  Home Medications   Prior to Admission medications   Medication Sig Start Date End Date Taking? Authorizing Provider  acetaminophen (TYLENOL) 500 MG tablet Take 1,000 mg by mouth every 6 (six) hours as needed for pain.    Historical Provider, MD  metroNIDAZOLE (FLAGYL) 500 MG tablet Take 1 tablet (500 mg total) by mouth 3 (three) times daily. One po bid x 7 days 03/22/14   Veryl Speak, MD   BP 156/102 mmHg  Pulse 94  Temp(Src) 98.6 F (37 C) (Oral)  Resp 20  Ht 5' 4.5" (1.638 m)  Wt 205 lb (92.987 kg)  BMI 34.66 kg/m2  SpO2 98%  LMP 10/09/2014  Vital signs normal except for hypertension  Physical Exam  Constitutional: She is oriented to person, place, and time. She appears well-developed  and well-nourished.  Non-toxic appearance. She does not appear ill. No distress.  HENT:  Head: Normocephalic and atraumatic.  Right Ear: External ear normal.  Left Ear: External ear normal.  Nose: Nose normal. No mucosal edema or rhinorrhea.  Mouth/Throat: Oropharynx is clear and moist and mucous membranes are normal. No dental abscesses or uvula swelling.  Eyes: Conjunctivae and EOM are normal. Pupils are equal, round, and reactive to light.  Neck: Normal range of motion and full passive range of motion without pain. Neck supple.  Cardiovascular: Normal rate, regular rhythm and normal heart sounds.  Exam reveals no gallop and no friction rub.   No murmur heard. Pulmonary/Chest: Effort normal. No respiratory distress.  She has wheezes. She has no rhonchi. She has no rales. She exhibits no tenderness and no crepitus.    Scattered wheezing.  Area of pain noted  Abdominal: Soft. Normal appearance and bowel sounds are normal. She exhibits no distension. There is no tenderness. There is no rebound and no guarding.  Musculoskeletal: Normal range of motion. She exhibits no edema or tenderness.  Moves all extremities well.   Neurological: She is alert and oriented to person, place, and time. She has normal strength. No cranial nerve deficit.  Skin: Skin is warm, dry and intact. No rash noted. No erythema. No pallor.  Psychiatric: She has a normal mood and affect. Her speech is normal and behavior is normal. Her mood appears not anxious.  Nursing note and vitals reviewed.   ED Course  Procedures (including critical care time)\ Medications  nicotine (NICODERM CQ - dosed in mg/24 hours) patch 21 mg (not administered)  LORazepam (ATIVAN) injection 0.5 mg (not administered)  aspirin chewable tablet 324 mg (324 mg Oral Given 11/01/14 0123)  nitroGLYCERIN (NITROGLYN) 2 % ointment 1 inch (1 inch Topical Given 11/01/14 0123)  ipratropium-albuterol (DUONEB) 0.5-2.5 (3) MG/3ML nebulizer solution 3 mL (3 mLs Nebulization Given 11/01/14 0117)  albuterol (PROVENTIL) (2.5 MG/3ML) 0.083% nebulizer solution 2.5 mg (2.5 mg Nebulization Given 11/01/14 0117)  nitroGLYCERIN 50 mg in dextrose 5 % 250 mL (0.2 mg/mL) infusion (15 mcg/min Intravenous New Bag/Given 11/01/14 0358)  acetaminophen (TYLENOL) tablet 650 mg (650 mg Oral Given 11/01/14 0430)    DIAGNOSTIC STUDIES: Oxygen Saturation is 98% on RA, normal by my interpretation.    COORDINATION OF CARE: 12:54 AM- Pt advised of plan for treatment which includes medication, radiology, and labs and pt agrees.  Heart score is 3   2:45 AM patient was rechecked. Her lungs were now clear after the albuterol/Atrovent nebulizer. She states she doesn't feel like she is wheezing any more.  She states her chest pain is improved. We discussed her test results including the possible increased fluid seen on her chest x-ray. BNP was done, patient has been complaining of some shortness of breath. She states she has a chronic cough and coughs up mucus that is various colors and is not worse than usual. She attributes this to smoking. It is not been worse recently. We discussed getting her second troponin which will be approximately 5 hours after the onset of her chest pain. She also states when she had the chest pressure she could take big deep breaths without difficulty did not feel like she had a restriction of her breathing.  Patient second troponin was noted to be mildly positive. She was started on a nitroglycerin drip.  Patient was discussed with Dr. Ashok Norris at 3:58 AM. She wants to get a third troponin now and call her  back. If her troponin is rising she will accept the patient to [facility]. However if her troponin is not increasing she could stay here in [facility] and get admitted to get an echo later today and possible stress test the following morning by cardiology.  Pt's 3rd troponin is slighting higher than the 2nd. She was started on a NTG drip.  05:50 Dr Radford Pax, accepts in transfer to [facility], stepdown. Agrees with starting heparin. Pt states her pain is a "1" on a scale of 10.  Pt advised of need for admission and transfer. PT wanting to leave to smoke. Started on nicotene patch and given ativan 0.5 mg IV.  Labs Review Results for orders placed or performed during the hospital encounter of 11/01/14  Comprehensive metabolic panel  Result Value Ref Range   Sodium 133 (L) 135 - 145 mmol/L   Potassium 3.8 3.5 - 5.1 mmol/L   Chloride 103 96 - 112 mmol/L   CO2 22 19 - 32 mmol/L   Glucose, Bld 269 (H) 70 - 99 mg/dL   BUN 17 6 - 23 mg/dL   Creatinine, Ser 0.66 0.50 - 1.10 mg/dL   Calcium 9.0 8.4 - 10.5 mg/dL   Total Protein 7.5 6.0 - 8.3 g/dL   Albumin 4.0 3.5 - 5.2 g/dL   AST  13 0 - 37 U/L   ALT 15 0 - 35 U/L   Alkaline Phosphatase 74 39 - 117 U/L   Total Bilirubin 0.4 0.3 - 1.2 mg/dL   GFR calc non Af Amer >90 >90 mL/min   GFR calc Af Amer >90 >90 mL/min   Anion gap 8 5 - 15  CBC with Differential  Result Value Ref Range   WBC 22.3 (H) 4.0 - 10.5 K/uL   RBC 4.60 3.87 - 5.11 MIL/uL   Hemoglobin 14.1 12.0 - 15.0 g/dL   HCT 41.2 36.0 - 46.0 %   MCV 89.6 78.0 - 100.0 fL   MCH 30.7 26.0 - 34.0 pg   MCHC 34.2 30.0 - 36.0 g/dL   RDW 12.7 11.5 - 15.5 %   Platelets 336 150 - 400 K/uL   Neutrophils Relative % 80 (H) 43 - 77 %   Neutro Abs 18.0 (H) 1.7 - 7.7 K/uL   Lymphocytes Relative 13 12 - 46 %   Lymphs Abs 2.9 0.7 - 4.0 K/uL   Monocytes Relative 6 3 - 12 %   Monocytes Absolute 1.2 (H) 0.1 - 1.0 K/uL   Eosinophils Relative 1 0 - 5 %   Eosinophils Absolute 0.1 0.0 - 0.7 K/uL   Basophils Relative 0 0 - 1 %   Basophils Absolute 0.1 0.0 - 0.1 K/uL  Troponin I  Result Value Ref Range   Troponin I <0.03 <0.031 ng/mL  Brain natriuretic peptide  Result Value Ref Range   B Natriuretic Peptide 13.0 0.0 - 100.0 pg/mL  Troponin I  Result Value Ref Range   Troponin I 0.04 (H) <0.031 ng/mL  Troponin I  Result Value Ref Range   Troponin I 0.06 (H) <0.031 ng/mL    Laboratory interpretation all normal except for hyperglycemia, and persistent leukocytosis, + second troponin   Imaging Review Dg Chest Port 1 View  11/01/2014   CLINICAL DATA:  Acute onset of intermittent mid chest pain for 2 days. Initial encounter.  EXAM: PORTABLE CHEST - 1 VIEW  COMPARISON:  Chest radiograph performed 05/11/2008  FINDINGS: The lungs are well-aerated. Mild vascular congestion is noted. There is no evidence  of focal opacification, pleural effusion or pneumothorax.  The cardiomediastinal silhouette is within normal limits. No acute osseous abnormalities are seen.  IMPRESSION: Mild vascular congestion noted; lungs remain grossly clear.   Electronically Signed   By: Garald Balding M.D.    On: 11/01/2014 02:24     EKG Interpretation   Date/Time:  Sunday November 01 2014 00:42:12 EDT Ventricular Rate:  96 PR Interval:  137 QRS Duration: 92 QT Interval:  359 QTC Calculation: 454 R Axis:   51 Text Interpretation:  Sinus rhythm Normal ECG No significant change since  last tracing 11 May 2008 Confirmed by [facility]  MD-I, Taggert Bozzi ([id number]) on  11/01/2014 1:02:56 AM     EKG Interpretation  Date/Time:  Sunday November 01 2014 04:48:36 EDT Ventricular Rate:  80 PR Interval:  147 QRS Duration: 93 QT Interval:  397 QTC Calculation: 458 R Axis:   44 Text Interpretation:  Sinus rhythm Normal ECG No significant change since last tracing EARLIER SAME DATE Confirmed by Gleason Ardoin  MD-I, Brooklyn Jeff ([id number]) on 11/01/2014 4:59:09 AM          MDM   Final diagnoses:  Chest pain, unspecified chest pain type  NSTEMI (non-ST elevated myocardial infarction)   Plan transfer to [facility] for admission   CRITICAL CARE Performed by: Rolland Porter L Total critical care time: 38 min Critical care time was exclusive of separately billable procedures and treating other patients. Critical care was necessary to treat or prevent imminent or life-threatening deterioration. Critical care was time spent personally by me on the following activities: development of treatment plan with patient and/or surrogate as well as nursing, discussions with consultants, evaluation of patient's response to treatment, examination of patient, obtaining history from patient or surrogate, ordering and performing treatments and interventions, ordering and review of laboratory studies, ordering and review of radiographic studies, pulse oximetry and re-evaluation of patient's condition.   I personally performed the services described in this documentation, which was scribed in my presence. The recorded information has been reviewed and considered.  Rolland Porter, MD, Barbette Or, MD 11/01/14 0700

## 2014-11-01 NOTE — ED Notes (Signed)
Pt reports that she is not on any medications, does not have insurance and has not been able to go to pcp,

## 2014-11-01 NOTE — ED Notes (Signed)
Pt very emotional and distraught over test results and converstion with EDP. Pt stated she was leaving to smoke. Again advised patient against this. Explained to pt that I could speak with EDP and possibly get something for her anxiety and perhaps a nicotine patch. Pt reluctantly agreed to allow nurse to talk to EDP.  Medication orders placed and Meds given to pt per EDP. Pt calmer at this time. Family at bedside.

## 2014-11-01 NOTE — ED Notes (Signed)
Pt states pain remains 4 or 5 on scale. Increased rate of nitro to 15 mcg.

## 2014-11-01 NOTE — Progress Notes (Signed)
ANTICOAGULATION CONSULT NOTE - Initial Consult  Pharmacy Consult for Heparin Indication: chest pain/ACS  No Known Allergies  Patient Measurements: Height: 5' 4.5" (163.8 cm) Weight: 205 lb (92.987 kg) IBW/kg (Calculated) : 55.85 HEPARIN DW (KG): 76.8  Vital Signs: Temp: 98.6 F (37 C) (04/24 0043) Temp Source: Oral (04/24 0043) BP: 155/100 mmHg (04/24 0600) Pulse Rate: 87 (04/24 0600)  Labs:  Recent Labs  11/01/14 0110 11/01/14 0306 11/01/14 0414  HGB 14.1  --   --   HCT 41.2  --   --   PLT 336  --   --   CREATININE 0.66  --   --   TROPONINI <0.03 0.04* 0.06*    Estimated Creatinine Clearance: 101.2 mL/min (by C-G formula based on Cr of 0.66).   Medical History: Past Medical History  Diagnosis Date  . Diabetes mellitus   . Hypertension    Assessment: 44yo female with CP.  Asked to initiate Heparin for ACS.  Goal of Therapy:  Heparin level 0.3-0.7 units/ml w/in 24 hrs of initiation Monitor platelets by anticoagulation protocol: Yes   Plan:  Heparin 4000 units bolus now x 1 Heparin infusion at 1100 units/hr Heparin level in 6hrs and daily CBC daily while on Heparin  Alyssa Morales, Keily Lepp A 11/01/2014,6:40 AM

## 2014-11-01 NOTE — H&P (Signed)
Cardiologist: Alyssa Morales is an 44 y.o. female.   Chief Complaint:  Chest pain HPI:   The patient is a 44 yo female with a history of obesity, untreated DM, 43.5py tobacco history(started at age 67) and HTN.  She was transferred from [facility] with chest pain.  Troponin 0.06.  CXR with mild vascular congestion.  EKG is NSR.  She had a left heart cath in May 2009 which revealed no significant CAD with EF of 65%.    The patient reports developing 8/10 chest pressure last night while sitting at the table.  It radiated down both arms and was associated with dyspnea and nausea.  She started a new job last week and was having the same chest pain when walking up the stairs only it resolved once exertion decreased.  The pain finally improved once the IV NTG was started and the dose increased.  The patient currently denies vomiting, fever,  orthopnea, dizziness, PND, cough, congestion, abdominal pain, hematochezia, melena, lower extremity edema, claudication.   Past Medical History  Diagnosis Date  . Diabetes mellitus   . Hypertension     Obesity  Past Surgical History  Procedure Laterality Date  . Cholecystectomy    . Cesarean section      No family history on file. Social History:  reports that she has been smoking Cigarettes.  She has a 30 pack-year smoking history. She has never used smokeless tobacco. She reports that she drinks alcohol. She reports that she does not use illicit drugs.  Allergies: No Known Allergies  Medications Prior to Admission  Medication Sig Dispense Refill  . acetaminophen (TYLENOL) 500 MG tablet Take 1,000 mg by mouth every 6 (six) hours as needed for pain.    . metroNIDAZOLE (FLAGYL) 500 MG tablet Take 1 tablet (500 mg total) by mouth 3 (three) times daily. One po bid x 7 days 21 tablet 0    Results for orders placed or performed during the hospital encounter of 11/01/14 (from the past 48 hour(s))  Comprehensive metabolic panel     Status:  Abnormal   Collection Time: 11/01/14  1:10 AM  Result Value Ref Range   Sodium 133 (L) 135 - 145 mmol/L   Potassium 3.8 3.5 - 5.1 mmol/L   Chloride 103 96 - 112 mmol/L   CO2 22 19 - 32 mmol/L   Glucose, Bld 269 (H) 70 - 99 mg/dL   BUN 17 6 - 23 mg/dL   Creatinine, Ser 0.66 0.50 - 1.10 mg/dL   Calcium 9.0 8.4 - 10.5 mg/dL   Total Protein 7.5 6.0 - 8.3 g/dL   Albumin 4.0 3.5 - 5.2 g/dL   AST 13 0 - 37 U/L   ALT 15 0 - 35 U/L   Alkaline Phosphatase 74 39 - 117 U/L   Total Bilirubin 0.4 0.3 - 1.2 mg/dL   GFR calc non Af Amer >90 >90 mL/min   GFR calc Af Amer >90 >90 mL/min    Comment: (NOTE) The eGFR has been calculated using the CKD EPI equation. This calculation has not been validated in all clinical situations. eGFR's persistently <90 mL/min signify possible Chronic Kidney Disease.    Anion gap 8 5 - 15  CBC with Differential     Status: Abnormal   Collection Time: 11/01/14  1:10 AM  Result Value Ref Range   WBC 22.3 (H) 4.0 - 10.5 K/uL   RBC 4.60 3.87 - 5.11 MIL/uL   Hemoglobin 14.1 12.0 -  15.0 g/dL   HCT 41.2 36.0 - 46.0 %   MCV 89.6 78.0 - 100.0 fL   MCH 30.7 26.0 - 34.0 pg   MCHC 34.2 30.0 - 36.0 g/dL   RDW 12.7 11.5 - 15.5 %   Platelets 336 150 - 400 K/uL   Neutrophils Relative % 80 (H) 43 - 77 %   Neutro Abs 18.0 (H) 1.7 - 7.7 K/uL   Lymphocytes Relative 13 12 - 46 %   Lymphs Abs 2.9 0.7 - 4.0 K/uL   Monocytes Relative 6 3 - 12 %   Monocytes Absolute 1.2 (H) 0.1 - 1.0 K/uL   Eosinophils Relative 1 0 - 5 %   Eosinophils Absolute 0.1 0.0 - 0.7 K/uL   Basophils Relative 0 0 - 1 %   Basophils Absolute 0.1 0.0 - 0.1 K/uL  Troponin I     Status: None   Collection Time: 11/01/14  1:10 AM  Result Value Ref Range   Troponin I <0.03 <0.031 ng/mL    Comment:        NO INDICATION OF MYOCARDIAL INJURY.   Brain natriuretic peptide     Status: None   Collection Time: 11/01/14  1:24 AM  Result Value Ref Range   B Natriuretic Peptide 13.0 0.0 - 100.0 pg/mL  Troponin I      Status: Abnormal   Collection Time: 11/01/14  3:06 AM  Result Value Ref Range   Troponin I 0.04 (H) <0.031 ng/mL    Comment:        PERSISTENTLY INCREASED TROPONIN VALUES IN THE RANGE OF 0.04-0.49 ng/mL CAN BE SEEN IN:       -UNSTABLE ANGINA       -CONGESTIVE HEART FAILURE       -MYOCARDITIS       -CHEST TRAUMA       -ARRYHTHMIAS       -LATE PRESENTING MYOCARDIAL INFARCTION       -COPD   CLINICAL FOLLOW-UP RECOMMENDED.   Troponin I     Status: Abnormal   Collection Time: 11/01/14  4:14 AM  Result Value Ref Range   Troponin I 0.06 (H) <0.031 ng/mL    Comment:        PERSISTENTLY INCREASED TROPONIN VALUES IN THE RANGE OF 0.04-0.49 ng/mL CAN BE SEEN IN:       -UNSTABLE ANGINA       -CONGESTIVE HEART FAILURE       -MYOCARDITIS       -CHEST TRAUMA       -ARRYHTHMIAS       -LATE PRESENTING MYOCARDIAL INFARCTION       -COPD   CLINICAL FOLLOW-UP RECOMMENDED.    Chest x-ray: Possible mild vascular congestion  Review of Systems  Constitutional: Negative for fever and diaphoresis.  HENT: Negative for congestion.   Respiratory: Positive for shortness of breath. Negative for cough.   Cardiovascular: Positive for chest pain. Negative for orthopnea, leg swelling and PND.  Gastrointestinal: Positive for nausea. Negative for vomiting, abdominal pain, blood in stool and melena.  Genitourinary: Negative for hematuria.  Musculoskeletal: Positive for myalgias (CP radiated down both arms) and back pain.  Neurological: Negative for dizziness.  All other systems reviewed and are negative.   Blood pressure 124/78, pulse 80, temperature 98.4 F (36.9 C), temperature source Oral, resp. rate 17, height 5' 4.5" (1.638 m), weight 197 lb (89.359 kg), last menstrual period 10/09/2014, SpO2 100 %. Physical Exam  Nursing note and vitals reviewed. Constitutional: She is oriented  to person, place, and time. She appears well-developed. No distress.  Obese  HENT:  Head: Normocephalic and  atraumatic.  Eyes: EOM are normal. Pupils are equal, round, and reactive to light. No scleral icterus.  Neck: Normal range of motion. Neck supple.  Cardiovascular: Normal rate, regular rhythm, S1 normal and S2 normal.   No murmur heard. Pulses:      Radial pulses are 2+ on the right side, and 2+ on the left side.       Dorsalis pedis pulses are 2+ on the right side, and 2+ on the left side.  No carotid bruit  Respiratory: Effort normal and breath sounds normal. She has no wheezes. She has no rales.  GI: Soft. Bowel sounds are normal. She exhibits no distension. There is no tenderness.  Musculoskeletal: She exhibits no edema.  Lymphadenopathy:    She has no cervical adenopathy.  Neurological: She is alert and oriented to person, place, and time. She exhibits normal muscle tone.  Skin: Skin is warm.  Psychiatric: She has a normal mood and affect.     Assessment/Plan Principal Problem:   NSTEMI (non-ST elevated myocardial infarction) Active Problems:   Unstable angina   Obesity   Tobacco abuse   Diabetes mellitus, type 2   noncompliance with medical treatment  Plan:  She is currently pain-free and is on intravenous heparin and nitroglycerin.  Importance of compliance with medical treatment as well as risk factor modification discussed with her.  Discussed importance of smoking cessation.  Plan cardiac catheterization tomorrow. Cardiac catheterization procedure was discussed with the patient fully including risks of myocardial infarction, death, stroke, bleeding, arrhythmia, dye allergy, or renal insufficiency. The patient understands and is willing to proceed.  Possibility of stenting of the same setting including risks discussed with patient.   Kerry Hough. MD [facility]  11/01/2014, 10:58 AM

## 2014-11-01 NOTE — ED Notes (Signed)
Pt asked if she could "go smoke". Educated pt on smoking secession, and explained that she should not leave campus. Pt stated she would not leave and thanked nurse for education.

## 2014-11-01 NOTE — Clinical Social Work Note (Signed)
CSW received blank consult on patient. Please reconsult if patient has any CSW related needs. CSW signing off at this time.   Liz Beach, MSW, [place], [place] [phone]

## 2014-11-01 NOTE — ED Notes (Signed)
EDP in to speak with pt about lab results

## 2014-11-01 NOTE — Progress Notes (Addendum)
ANTICOAGULATION CONSULT NOTE - Follow Up Consult  Pharmacy Consult:  Heparin Indication: chest pain/ACS  No Known Allergies  Patient Measurements: Height: 5' 4.5" (163.8 cm) Weight: 197 lb (89.359 kg) IBW/kg (Calculated) : 55.85 Heparin Dosing Weight: 77 kg  Vital Signs: Temp: 97.8 F (36.6 C) (04/24 1209) Temp Source: Oral (04/24 1209) BP: 125/81 mmHg (04/24 1209) Pulse Rate: 78 (04/24 1209)  Labs:  Recent Labs  11/01/14 0110 11/01/14 0306 11/01/14 0414 11/01/14 1100  HGB 14.1  --   --   --   HCT 41.2  --   --   --   PLT 336  --   --   --   CREATININE 0.66  --   --   --   TROPONINI <0.03 0.04* 0.06* 0.08*    Estimated Creatinine Clearance: 99.2 mL/min (by C-G formula based on Cr of 0.66).     Assessment: 54 YOF presented to [facility] with chest pain and initiated on IV heparin.  Patient transfers to [facility] for further management.  Heparin level is sub-therapeutic.  RN reported that patient was not infusing at 1100 units/hr as ordered when she was transferred here and rate was adjusted around 0800.  No bleeding reported.   Goal of Therapy:  Heparin level 0.3-0.7 units/ml Monitor platelets by anticoagulation protocol: Yes    Plan:  - Rebolus with heparin 2000 units IV x 1, then - Increase heparin gtt to 1300 units/hr - Check 6 hr HL - Daily HL / CBC    Thuy D. Mina Marble, PharmD, BCPS Pager:  [phone] 11/01/2014, 2:11 PM   Addendum: Heparin level remains subtherapeutic 0.15 on 1300 unit/hr  Plan:  - Rebolus with heparin 2000 units IV x 1, then - Increase heparin gtt to 1500 units/hr - f/u AM labs  Maryanna Shape, PharmD, BCPS  Clinical Pharmacist  Pager: [phone]

## 2014-11-02 ENCOUNTER — Encounter (HOSPITAL_COMMUNITY)
Admission: EM | Disposition: A | Discharge status: Home / Self Care | Payer: Self-pay | Source: Home / Self Care | Attending: Cardiology

## 2014-11-02 ENCOUNTER — Encounter (HOSPITAL_COMMUNITY): Payer: Self-pay | Admitting: Physician Assistant

## 2014-11-02 DIAGNOSIS — I10 Essential (primary) hypertension: Secondary | ICD-10-CM

## 2014-11-02 DIAGNOSIS — E118 Type 2 diabetes mellitus with unspecified complications: Secondary | ICD-10-CM

## 2014-11-02 DIAGNOSIS — I2510 Atherosclerotic heart disease of native coronary artery without angina pectoris: Secondary | ICD-10-CM

## 2014-11-02 DIAGNOSIS — D72829 Elevated white blood cell count, unspecified: Secondary | ICD-10-CM

## 2014-11-02 DIAGNOSIS — E781 Pure hyperglyceridemia: Secondary | ICD-10-CM

## 2014-11-02 DIAGNOSIS — E871 Hypo-osmolality and hyponatremia: Secondary | ICD-10-CM

## 2014-11-02 HISTORY — PX: LEFT HEART CATHETERIZATION WITH CORONARY ANGIOGRAM: SHX5451

## 2014-11-02 LAB — PREGNANCY, URINE: PREG TEST UR: NEGATIVE

## 2014-11-02 LAB — CBC
HCT: 41 % (ref 36.0–46.0)
HEMOGLOBIN: 13.7 g/dL (ref 12.0–15.0)
MCH: 30 pg (ref 26.0–34.0)
MCHC: 33.4 g/dL (ref 30.0–36.0)
MCV: 89.9 fL (ref 78.0–100.0)
PLATELETS: 311 10*3/uL (ref 150–400)
RBC: 4.56 MIL/uL (ref 3.87–5.11)
RDW: 12.9 % (ref 11.5–15.5)
WBC: 9.9 10*3/uL (ref 4.0–10.5)

## 2014-11-02 LAB — BASIC METABOLIC PANEL
ANION GAP: 10 (ref 5–15)
BUN: 11 mg/dL (ref 6–23)
CHLORIDE: 99 mmol/L (ref 96–112)
CO2: 22 mmol/L (ref 19–32)
Calcium: 8.9 mg/dL (ref 8.4–10.5)
Creatinine, Ser: 0.68 mg/dL (ref 0.50–1.10)
GFR calc Af Amer: >90 mL/min (ref >90)
GFR calc non Af Amer: >90 mL/min (ref >90)
Glucose, Bld: 268 mg/dL — ABNORMAL HIGH (ref 70–99)
Potassium: 3.9 mmol/L (ref 3.5–5.1)
Sodium: 131 mmol/L — ABNORMAL LOW (ref 135–145)

## 2014-11-02 LAB — LIPID PANEL
CHOLESTEROL: 126 mg/dL (ref 0–200)
HDL: 25 mg/dL — AB (ref >39)
LDL Cholesterol: UNDETERMINED mg/dL (ref 0–99)
Total CHOL/HDL Ratio: 5 RATIO
Triglycerides: 471 mg/dL — ABNORMAL HIGH (ref <150)
VLDL: UNDETERMINED mg/dL (ref 0–40)

## 2014-11-02 LAB — HEMOGLOBIN A1C
HEMOGLOBIN A1C: 10 % — AB (ref 4.8–5.6)
Mean Plasma Glucose: 240 mg/dL

## 2014-11-02 LAB — PROTIME-INR
INR: 0.99 (ref 0.00–1.49)
Prothrombin Time: 13.2 seconds (ref 11.6–15.2)

## 2014-11-02 LAB — GLUCOSE, CAPILLARY
GLUCOSE-CAPILLARY: 191 mg/dL — AB (ref 70–99)
Glucose-Capillary: 234 mg/dL — ABNORMAL HIGH (ref 70–99)
Glucose-Capillary: 292 mg/dL — ABNORMAL HIGH (ref 70–99)

## 2014-11-02 LAB — HEPARIN LEVEL (UNFRACTIONATED): HEPARIN UNFRACTIONATED: 0.36 [IU]/mL (ref 0.30–0.70)

## 2014-11-02 SURGERY — LEFT HEART CATHETERIZATION WITH CORONARY ANGIOGRAM
Anesthesia: LOCAL

## 2014-11-02 MED ORDER — ALBUTEROL SULFATE (2.5 MG/3ML) 0.083% IN NEBU
3.0000 mL | INHALATION_SOLUTION | Freq: Four times a day (QID) | RESPIRATORY_TRACT | Status: DC | PRN
  Administered 2014-11-02: 3 mL via RESPIRATORY_TRACT
  Filled 2014-11-02: qty 3

## 2014-11-02 MED ORDER — ATORVASTATIN CALCIUM 40 MG PO TABS: 40.0000 mg | ORAL_TABLET | Freq: Every evening | ORAL | Status: DC

## 2014-11-02 MED ORDER — HEPARIN (PORCINE) IN NACL 2-0.9 UNIT/ML-% IJ SOLN
INTRAMUSCULAR | Status: AC
  Filled 2014-11-02: qty 1500

## 2014-11-02 MED ORDER — OMEGA-3-ACID ETHYL ESTERS 1 G PO CAPS: 2.0000 g | ORAL_CAPSULE | Freq: Two times a day (BID) | ORAL | Status: DC

## 2014-11-02 MED ORDER — LIDOCAINE HCL (PF) 1 % IJ SOLN
INTRAMUSCULAR | Status: AC
  Filled 2014-11-02: qty 30

## 2014-11-02 MED ORDER — FENOFIBRATE 160 MG PO TABS: 160.0000 mg | ORAL_TABLET | Freq: Every day | ORAL | Status: DC

## 2014-11-02 MED ORDER — HEPARIN SODIUM (PORCINE) 1000 UNIT/ML IJ SOLN
INTRAMUSCULAR | Status: AC
  Filled 2014-11-02: qty 1

## 2014-11-02 MED ORDER — METOPROLOL TARTRATE 25 MG PO TABS: 12.5000 mg | ORAL_TABLET | Freq: Two times a day (BID) | ORAL | Status: DC

## 2014-11-02 MED ORDER — HEPARIN (PORCINE) IN NACL 100-0.45 UNIT/ML-% IJ SOLN
1550.0000 [IU]/h | INTRAMUSCULAR | Status: DC
  Administered 2014-11-02: 1550 [IU]/h via INTRAVENOUS

## 2014-11-02 MED ORDER — SODIUM CHLORIDE 0.9 % IV SOLN
1.0000 mL/kg/h | INTRAVENOUS | Status: AC
Start: 2014-11-02 — End: 2014-11-02

## 2014-11-02 MED ORDER — MIDAZOLAM HCL 2 MG/2ML IJ SOLN
INTRAMUSCULAR | Status: AC
  Filled 2014-11-02: qty 2

## 2014-11-02 MED ORDER — ASPIRIN 81 MG PO TBEC: 81.0000 mg | DELAYED_RELEASE_TABLET | Freq: Every day | ORAL | Status: AC

## 2014-11-02 MED ORDER — ACETAMINOPHEN 325 MG PO TABS: 650.0000 mg | ORAL_TABLET | ORAL | Status: DC | PRN

## 2014-11-02 MED ORDER — FENTANYL CITRATE (PF) 100 MCG/2ML IJ SOLN
INTRAMUSCULAR | Status: AC
  Filled 2014-11-02: qty 2

## 2014-11-02 MED ORDER — VERAPAMIL HCL 2.5 MG/ML IV SOLN
INTRAVENOUS | Status: AC
  Filled 2014-11-02: qty 2

## 2014-11-02 MED ORDER — OXYCODONE-ACETAMINOPHEN 5-325 MG PO TABS: 1.0000 | ORAL_TABLET | ORAL | Status: DC | PRN

## 2014-11-02 MED ORDER — NITROGLYCERIN 0.4 MG SL SUBL: 0.4000 mg | SUBLINGUAL_TABLET | SUBLINGUAL | Status: DC | PRN

## 2014-11-02 MED ORDER — OMEGA-3-ACID ETHYL ESTERS 1 G PO CAPS
2.0000 g | ORAL_CAPSULE | Freq: Two times a day (BID) | ORAL | Status: DC
Start: 2014-11-02 — End: 2014-11-02

## 2014-11-02 NOTE — Care Management Note (Unsigned)
    Page 1 of 1   11/02/2014     4:59:49 PM CARE MANAGEMENT NOTE 11/02/2014  Patient:  OAKLIE, DURRETT A   Account Number:  [id number]  Date Initiated:  11/02/2014  Documentation initiated by:  Elenor Quinones  Subjective/Objective Assessment:   Pt admitted for non stemi     Action/Plan:   Pt will discharge home.  CM provided information on PCP and medication assistance in [facility]   Anticipated DC Date:  11/02/2014   Anticipated DC Plan:  [place]  CM consult  [facility]  Medication Assistance      Choice offered to / List presented to:             Status of service:  Completed, signed off Medicare Important Message given?  NO (If response is "NO", the following Medicare IM given date fields will be blank) Date Medicare IM given:   Medicare IM given by:   Date Additional Medicare IM given:   Additional Medicare IM given by:    Discharge Disposition:  HOME/SELF CARE  Per UR Regulation:    If discussed at Long Length of Stay Meetings, dates discussed:    Comments:  11/02/14 Elenor Quinones, RN, BSN [phone].   CM spoke with pt regarding PCP and medication assistance.  CM called [facility] and made apppointment for pt 5/16 at 1pm, CM also left message for Garnette Gunner (medication assistance) to futher assist in medication needs.  CM Provided pt appointment information and also provided encouragement regarding keeping appointment and being honest with staff regarding needs including medication.  No other CM needs at this time.

## 2014-11-02 NOTE — Discharge Summary (Signed)
Discharge Summary   Patient ID: Alyssa Morales MRN: [id number], DOB/AGE: 44-11-1970 44 y.o. Admit date: 11/01/2014 D/C date:     11/02/2014  Primary Care Provider: No PCP Per Patient Primary Cardiologist: Will be new to [facility] team  Primary Discharge Diagnoses:  1. Elevated troponin concerning for NSTEMI 2. Coronary artery disease 3. Tobacco abuse since age 23, counseled regarding cessation 4. Diabetes mellitus, uncontrolled A1C 10.0 5. HTN, controlled 6. Hyponatremia, corrects to normal for glucose 7. Hypertriglyceridemia  8. Leukocytosis, resolved 9. Obesity Body mass index is 33.25 kg/(m^2).  Hospital Course: Alyssa Morales is a 44 y/o F with history of HTN, tobacco abuse since age 44, obesity, diabetes mellitus who presented to [facility] overnight the early morning hours of 11/01/2014 with chest pain. She has not followed up with primary care for several years due to lack of insurance and has not been on treatment for her diabetes. She presented with 8/10 chest pressure associated with dyspnea and nausea, radiating down both arms. She started a new job last week and was having the same pain when walking up the stairs, only it resolved once exertion decreased. Chest pain finally improved once she was given IV NTG. She was transferred to [facility] for further evaluation as she ruled in for mild NSTEMI with troponin trend peaking to 0.09. She was treated with IV heparin. WBC initially 22k, normalizing on recheck. A1C 10 indicating uncontrolled DM. Lipid panel was deranged with trig 471, HDL 25, LDL unable to be calculated. CXR:  Mild vascular congestion noted; lungs remain grossly clear. BNP was normal. Na was 131 but normalized to 135 when corrected for glucose. CBC wnl. She was started on aspirin, BB, statin, fenofibrate and Lovaza. Smoking cessation strongly advised. She underwent cath showing: IMPRESSIONS: 1. First diagonal with 50-70% ostial narrowing. The vessel is small in distribution.  2. Widely  patent LAD, circumflex, and RCA. 3. Overall normal LV function Continued medical therapy was recommended with the above medications. It is hoped that with tobacco cessation it will be easier for the patient to afford her medications. We recommended to observe the patient overnight post-cath but she insisted on being considered for early discharge. She has ambulated post-cath without recurrent chest pain and is hemodynamically stable. Dr. Claiborne Billings has seen and examined the patient today and feels she is stable for discharge. She was seen by care management who helped her set up an appointment with primary care in [place]. Since she is post-cath we deferred starting metformin. If she has not yet seen primary care by time of TOC visit in our office, we can consider rechecking renal function to make sure it's stable post-cath and starting it then. Will need arrangement of f/u lipids/LFTs in 6 weeks.  I sent message to after hours scheduling team to arrange 1 week TOC visit in [place] where the patient prefers to f/u.  Discharge Vitals: Blood pressure 124/81, pulse 82, temperature 98.9 F (37.2 C), temperature source Oral, resp. rate 19, height 5' 4.5" (1.638 m), weight 196 lb 11.2 oz (89.223 kg), last menstrual period 10/09/2014, SpO2 95 %.  Labs: Lab Results  Component Value Date   WBC 9.9 11/02/2014   HGB 13.7 11/02/2014   HCT 41.0 11/02/2014   MCV 89.9 11/02/2014   PLT 311 11/02/2014    Recent Labs Lab 11/01/14 0110 11/02/14 0444  NA 133* 131*  K 3.8 3.9  CL 103 99  CO2 22 22  BUN 17 11  CREATININE 0.66 0.68  CALCIUM 9.0  8.9  PROT 7.5  --   BILITOT 0.4  --   ALKPHOS 74  --   ALT 15  --   AST 13  --   GLUCOSE 269* 268*    Recent Labs  11/01/14 0414 11/01/14 1100 11/01/14 1610 11/01/14 2111  TROPONINI 0.06* 0.08* 0.06* 0.09*   Lab Results  Component Value Date   CHOL 126 11/02/2014   HDL 25* 11/02/2014   LDLCALC UNABLE TO CALCULATE IF TRIGLYCERIDE OVER 400 mg/dL  11/02/2014   TRIG 471* 11/02/2014    Diagnostic Studies/Procedures   Dg Chest Port 1 View  11/01/2014   CLINICAL DATA:  Acute onset of intermittent mid chest pain for 2 days. Initial encounter.  EXAM: PORTABLE CHEST - 1 VIEW  COMPARISON:  Chest radiograph performed 05/11/2008  FINDINGS: The lungs are well-aerated. Mild vascular congestion is noted. There is no evidence of focal opacification, pleural effusion or pneumothorax.  The cardiomediastinal silhouette is within normal limits. No acute osseous abnormalities are seen.  IMPRESSION: Mild vascular congestion noted; lungs remain grossly clear.   Electronically Signed   By: Garald Balding M.D.   On: 11/01/2014 02:24   Cardiac catheterization this admission, please see full report and above for summary.   Discharge Medications   Current Discharge Medication List    START taking these medications   Details  aspirin EC 81 MG EC tablet Take 1 tablet (81 mg total) by mouth daily. Qty: 30 tablet, Refills: 11    atorvastatin (LIPITOR) 40 MG tablet Take 1 tablet (40 mg total) by mouth every evening. Qty: 30 tablet, Refills: 3    fenofibrate 160 MG tablet Take 1 tablet (160 mg total) by mouth daily. Qty: 30 tablet, Refills: 3    metoprolol tartrate (LOPRESSOR) 25 MG tablet Take 0.5 tablets (12.5 mg total) by mouth 2 (two) times daily. Qty: 30 tablet, Refills: 3    nitroGLYCERIN (NITROSTAT) 0.4 MG SL tablet Place 1 tablet (0.4 mg total) under the tongue every 5 (five) minutes as needed for chest pain (up to 3 doses). Qty: 25 tablet, Refills: 3    omega-3 acid ethyl esters (LOVAZA) 1 G capsule Take 2 capsules (2 g total) by mouth 2 (two) times daily. Qty: 120 capsule, Refills: 3      CONTINUE these medications which have NOT CHANGED   Details  albuterol (PROVENTIL HFA;VENTOLIN HFA) 108 (90 BASE) MCG/ACT inhaler Inhale 1 puff into the lungs every 6 (six) hours as needed for wheezing or shortness of breath.    acetaminophen (TYLENOL) 500  MG tablet Take 1,000 mg by mouth every 6 (six) hours as needed for pain.      STOP taking these medications     oxymetazoline (AFRIN) 0.05 % nasal spray         Disposition   The patient will be discharged in stable condition to home. Discharge Instructions    Diet - low sodium heart healthy    Complete by:  As directed      Increase activity slowly    Complete by:  As directed   No driving for 1 week. No lifting over 10 lbs for 2 weeks. No sexual activity for 2 weeks. You may return to work in 1 week. Keep procedure site clean & dry. If you notice increased pain, swelling, bleeding or pus, call/return!  You may shower, but no soaking baths/hot tubs/pools for 1 week.   Patients with coronary artery disease should not take Afrin (oxymetazoline) due to risk of  decreasing blood flow to the heart arteries. We discontinued this off your medicine list.  The website to look for medication coupons is [employer].          Follow-up Information    Follow up with [facility] On 2/42/3536.   Specialty:  Occupational Therapy   Why:  For your uncontrolled diabetes and other general medical health maintenance.   Contact information:   [address] [place]  [address] [phone]       Follow up with [facility].   Specialty:  Cardiology   Why:  Office will call you for your followup appointment. Call office if you have not heard back in 3 days.   Contact information:   [place] [phone]        Duration of Discharge Encounter: Greater than 30 minutes including physician and PA time.  Signed, Melina Copa PA-C 11/02/2014, 5:28 PM

## 2014-11-02 NOTE — Progress Notes (Signed)
Patient: Alyssa Morales / Admit Date: 11/01/2014 / Date of Encounter: 11/02/2014, 9:46 AM   Subjective: No further chest pain since IV NTG use.   Objective: Telemetry: NSR Physical Exam: Blood pressure 135/90, pulse 84, temperature 98.9 F (37.2 C), temperature source Oral, resp. rate 17, height 5' 4.5" (1.638 m), weight 196 lb 11.2 oz (89.223 kg), last menstrual period 10/09/2014, SpO2 97 %. General: Well developed, well nourished F, in no acute distress. Head: Normocephalic, atraumatic, sclera non-icteric, no xanthomas, nares are without discharge. Neck: JVP not elevated. Lungs: Faint intermittent expiratory wheezing but generally good air movement. No rales or rhonchi. Breathing is unlabored. Heart: RRR S1 S2 without murmurs, rubs, or gallops.  Abdomen: Soft, non-tender, non-distended with normoactive bowel sounds. No rebound/guarding. Extremities: No clubbing or cyanosis. No edema. Distal pedal pulses are 2+ and equal bilaterally. Neuro: Alert and oriented X 3. Moves all extremities spontaneously. Psych:  Responds to questions appropriately with a normal affect.   Intake/Output Summary (Last 24 hours) at 11/02/14 0946 Last data filed at 11/02/14 0236  Gross per 24 hour  Intake    540 ml  Output   1400 ml  Net   -860 ml    Inpatient Medications:  . [START ON 11/03/2014] aspirin EC  81 mg Oral Daily  . atorvastatin  40 mg Oral q1800  . insulin aspart  0-9 Units Subcutaneous TID WC  . metoprolol tartrate  12.5 mg Oral BID  . nicotine  21 mg Transdermal Daily  . pneumococcal 23 valent vaccine  0.5 mL Intramuscular Tomorrow-1000   Infusions:  . sodium chloride 1 mL/kg/hr (11/02/14 0402)  . heparin 1,550 Units/hr (11/02/14 0624)    Labs:  Recent Labs  11/01/14 0110 11/02/14 0444  NA 133* 131*  K 3.8 3.9  CL 103 99  CO2 22 22  GLUCOSE 269* 268*  BUN 17 11  CREATININE 0.66 0.68  CALCIUM 9.0 8.9    Recent Labs  11/01/14 0110  AST 13  ALT 15  ALKPHOS 74    BILITOT 0.4  PROT 7.5  ALBUMIN 4.0    Recent Labs  11/01/14 0110 11/02/14 0444  WBC 22.3* 9.9  NEUTROABS 18.0*  --   HGB 14.1 13.7  HCT 41.2 41.0  MCV 89.6 89.9  PLT 336 311    Recent Labs  11/01/14 0414 11/01/14 1100 11/01/14 1610 11/01/14 2111  TROPONINI 0.06* 0.08* 0.06* 0.09*   Invalid input(s): POCBNP No results for input(s): HGBA1C in the last 72 hours.     Lipid Panel     Component Value Date/Time   CHOL 126 11/02/2014 0444   TRIG 471* 11/02/2014 0444   HDL 25* 11/02/2014 0444   CHOLHDL 5.0 11/02/2014 0444   VLDL UNABLE TO CALCULATE IF TRIGLYCERIDE OVER 400 mg/dL 11/02/2014 0444   LDLCALC UNABLE TO CALCULATE IF TRIGLYCERIDE OVER 400 mg/dL 11/02/2014 0444    Radiology/Studies:  Dg Chest Port 1 View  11/01/2014   CLINICAL DATA:  Acute onset of intermittent mid chest pain for 2 days. Initial encounter.  EXAM: PORTABLE CHEST - 1 VIEW  COMPARISON:  Chest radiograph performed 05/11/2008  FINDINGS: The lungs are well-aerated. Mild vascular congestion is noted. There is no evidence of focal opacification, pleural effusion or pneumothorax.  The cardiomediastinal silhouette is within normal limits. No acute osseous abnormalities are seen.  IMPRESSION: Mild vascular congestion noted; lungs remain grossly clear.   Electronically Signed   By: Garald Balding M.D.   On: 11/01/2014 02:24  Assessment and Plan  1. Chest pain/elevated troponin concerning for NSTEMI 2. Tobacco abuse since age 53, counseled regarding cessation 3. Diabetes mellitus, suspected uncontrolled 4. HTN, controlled 5. Hyponatremia, corrects to normal for glucose 6. Hypertriglyceridemia  7. Leukocytosis, resolved 8. Obesity Body mass index is 33.25 kg/(m^2).  A1C pending, but diabetes suspected to be uncontrolled given high sugars here and triglyceride level. Patient has not been under recent care for her DM or taken any diabetic medications due to lack of insurance. On SSI as inpatient. Will  place care management consult. Tobacco cessation advised. Suspect some degree of COPD given faint wheezing. She seems motivated to quit - nicotine patch has worked well so far.  She is for cath today. Risks and benefits of cardiac catheterization have been discussed with the patient. These include bleeding, infection, kidney damage, stroke, heart attack, death. The patient understands these risks and is willing to proceed.  Signed, Melina Copa PA-C Pager: [phone]   Patient seen and examined. Agree with assessment and plan. TR 471; suspect very high number of small LDL particles and inc LDL-P if tested. Will need aggressive treatment. Will add fenofibrate 145 mg daily and lovaza 2 capsules bid. Discussed smoking cessation. Discussed cath; plan for later today.   Troy Sine, MD, [facility] 11/02/2014 10:52 AM

## 2014-11-02 NOTE — Progress Notes (Signed)
Inpatient Diabetes Program Recommendations  AACE/ADA: New Consensus Statement on Inpatient Glycemic Control (2013)  Target Ranges:  Prepandial:   less than 140 mg/dL      Peak postprandial:   less than 180 mg/dL (1-2 hours)      Critically ill patients:  140 - 180 mg/dL   Results for ABUK, SELLECK (MRN [id number]) as of 11/02/2014 09:08  Ref. Range 11/01/2014 11:27 11/01/2014 17:10 11/01/2014 21:09 11/02/2014 07:50  Glucose-Capillary Latest Ref Range: 70-99 mg/dL 181 (H) 206 (H) 228 (H) 234 (H)   Reason for Visit: CP Diabetes history: DM 2 Outpatient Diabetes medications: None Current orders for Inpatient glycemic control: Novolog 0-9 units TID   Inpatient Diabetes Program Recommendations  Correction (SSI): Please increase correction scale to Moderate, Novolog 0-15 units TID.  Patient may benefit from low dose basal, will watch trend while NPO for cath today.  Note: A1c is pending depending on the level of A1c percent patient may need to be prescribed either an oral medication or insulin at discharge.  Thanks,  Tama Headings RN, MSN, [facility] Inpatient Diabetes Coordinator Team Pager [phone]

## 2014-11-02 NOTE — Progress Notes (Signed)
Spoke to Dr. Luiz Ochoa, Nitro gtt is to be continued.

## 2014-11-02 NOTE — Progress Notes (Signed)
ANTICOAGULATION CONSULT NOTE - Follow Up Consult  Pharmacy Consult:  Heparin Indication: chest pain/ACS  No Known Allergies  Patient Measurements: Height: 5' 4.5" (163.8 cm) Weight: 197 lb (89.359 kg) IBW/kg (Calculated) : 55.85 Heparin Dosing Weight: 77 kg  Vital Signs: Temp: 98.8 F (37.1 C) (04/25 0011) BP: 118/70 mmHg (04/25 0402) Pulse Rate: 79 (04/25 0011)  Labs:  Recent Labs  11/01/14 0110  11/01/14 1100 11/01/14 1315 11/01/14 1610 11/01/14 2111 11/02/14 0444  HGB 14.1  --   --   --   --   --  13.7  HCT 41.2  --   --   --   --   --  41.0  PLT 336  --   --   --   --   --  311  HEPARINUNFRC  --   --   --  0.11*  --  0.15* 0.36  CREATININE 0.66  --   --   --   --   --  0.68  TROPONINI <0.03  < > 0.08*  --  0.06* 0.09*  --   < > = values in this interval not displayed.  Estimated Creatinine Clearance: 99.2 mL/min (by C-G formula based on Cr of 0.68).     Assessment: 6 hour heparin level = 0.36 on 1500 units/hr in this 44 y.o female on heparin drip for chest pain. Therapeutic heparin level this morning. CBC stable and within normal limits.  No bleeding noted per RN's report. Therapeutic x 1 level.  At low end of therapeutic. I will bump heparin rate up a little to keep HL within goal.    Goal of Therapy:  Heparin level 0.3-0.7 units/ml Monitor platelets by anticoagulation protocol: Yes    Plan:  - Increase heparin gtt to 1550 units/hr - Check 6 hr HL to confirm therapeutic x2 then daily HL / CBC   Nicole Cella, RPh Clinical Pharmacist Pager: [phone] 11/02/2014, 6:07 AM

## 2014-11-02 NOTE — CV Procedure (Signed)
     Left Heart Catheterization with Coronary Angiography Report  Alyssa Morales  44 y.o.  female 04/26/1971  Procedure Date: 11/02/2014 Referring Physician: Landry Corporal, M.D. Primary Cardiologist: Jenkins Rouge, M.D.  INDICATION: Prolonged chest pain, trivial elevation in troponin, diabetes, tobacco use, and hypertension.  PROCEDURE: 1. Left heart catheterization; 2. Coronary angiography; 3. Left ventriculography  CONSENT:  The risks, benefits, and details of the procedure were explained in detail to the patient. Risks including death, stroke, heart attack, kidney injury, allergy, limb ischemia, bleeding and radiation injury were discussed.  The patient verbalized understanding and wanted to proceed.  Informed written consent was obtained.  PROCEDURE TECHNIQUE:  After Xylocaine anesthesia5 FRENCH Slendereath was placed in the right  radial artery with an angiocath and the modified Seldinger technique.  Coronary angiography was done using a JR 4 and JL 3.5 cm diagnostic catheters.  Left ventriculography was done using the  JR4 and hand injection.   After the images were reviewed, the case was terminated and hemostasis was achieved with a radial wrist band at 13 cc of air.   CONTRAST:  Total of 70 cc.  COMPLICATIONS:   none   HEMODYNAMICS:  Aortic pressure 131/83 mmHg; LV pressure 130/2 mmHg; LVEDP 14 mmHg  ANGIOGRAPHIC DATA:   The left main coronary artery normal.  The left anterior descending artery is widely patent and wraps around the left ventricular apex. The LAD gives origin to 3 diagonal branches. The second diagonal is small in distribution and contains ostial eccentric 50-70% narrowing. The first diagonal and the third diagonal or widely patent.   The left circumflex artery is  widely patent. 2 obtuse marginal branches arise from bed and are free of any significant obstruction..  The right coronary artery is dominant. There is irregularity distally with up to 30%  narrowing. The PDA and a large left ventricular branch arises distally. No significant obstruction is noted.Marland Kitchen   LEFT VENTRICULOGRAM:  Left ventricular angiogram was done in the 30 RAO projection and revealeda normal LV cavity size and overall normal contractility with an ejection fraction of 60%.   IMPRESSIONS:   1.  First diagonal with 50-70% ostial narrowing. The vessel is small in distribution.   2. Widely patent LAD, circumflex, and RCA.  3. Overall normal LV function   RECOMMENDATION:  Discontinue IV heparin and IV nitroglycerin.  Risk factor modification.

## 2014-11-03 ENCOUNTER — Encounter (HOSPITAL_COMMUNITY): Payer: Self-pay | Admitting: Interventional Cardiology

## 2014-11-10 ENCOUNTER — Encounter: Payer: Self-pay | Admitting: Cardiology

## 2014-11-10 ENCOUNTER — Ambulatory Visit (INDEPENDENT_AMBULATORY_CARE_PROVIDER_SITE_OTHER): Payer: Self-pay | Admitting: Cardiology

## 2014-11-10 VITALS — BP 116/84 | HR 71 | Ht 64.0 in | Wt 196.0 lb

## 2014-11-10 DIAGNOSIS — E785 Hyperlipidemia, unspecified: Secondary | ICD-10-CM

## 2014-11-10 NOTE — Patient Instructions (Addendum)
Your physician wants you to follow-up in: 6 months with Dr.McDowell You will receive a reminder letter in the mail two months in advance. If you don't receive a letter, please call our office to schedule the follow-up appointment.   Your physician recommends that you continue on your current medications as directed. Please refer to the Current Medication list given to you today.   Please get FASTING Lipid and LFT profile in 2 months   Please start walking program starting 10 minutes daily indoors 3 times a week,without hills. A [employer] hardware store or [place] is a good place to walk. Add 1 minute to walking after each week, keep heart at no higher than 30 beats over your resting heart rate (71 +30= 101 beat per minute) you can purchase a heart monitor at [facility] for $35 and check your pulse during exercise. It is also a good idea to purchase a [employer] blood pressure monitor    Thank you for choosing [facility] !

## 2014-11-10 NOTE — Progress Notes (Signed)
Cardiology Office Note   Date:  11/10/2014   ID:  Alyssa Morales, DOB December 18, 1970, MRN [id number]  PCP:  No PCP Per Patient  Cardiologist:  Ival Bible, [facility] followup - clearance to return to work  History of Present Illness: Alyssa Morales is a 44 y.o. female with a history of episodic exertional chest heaviness who developed bilateral arm pain 04/24. She went to [facility] ER>>tx to [facility] for ez trending up>>cath 04/25 w/ some small vessel dz, nl EF, med rx>> started on ASA 81 mg, SL NTG and metoprolol. Pt restarted metformin, lisinopril and fish oil she had at home after d/c.  Alyssa Morales presents for follow up evaluation. She wishes to return to work and asks about a note for jury duty.  Since d/c, she has not had the chest heaviness or bilateral arm pain she had prior to admission. She is trying to eat a HH DM diet. She is wearing a nicotine patch, but is still smoking some. She has no LE edema, no orthopnea or PND. She feels anxious and stressed at times.  She feels her stamina is less than previously and she is getting some DOE that she does not remember having prior to the hospitalization. She is worried about increasing her activity.   She cannot afford cardiac rehab and has problems affording her medications. She has an appointment with the [facility] in 2 weeks, hopefully will get some help with meds and get a primary MD through them.   Past Medical History  Diagnosis Date  . Diabetes mellitus with complication   . Hypertension   . CAD (coronary artery disease)     a. NSTEMI (peak trop 0.06) 10/2014 - cath showing first diagonal with 50-70% ostial narrowing, small in distribution, widely patent LAD/LCx/RCA, EF normal.  . Tobacco abuse   . Dyslipidemia   . Hypertriglyceridemia   . Obesity     Past Surgical History  Procedure Laterality Date  . Cholecystectomy    . Cesarean section    . Left heart catheterization with coronary angiogram N/A  11/02/2014    Procedure: LEFT HEART CATHETERIZATION WITH CORONARY ANGIOGRAM;  Surgeon: Belva Crome, MD;  Location: [facility] CATH LAB;  Service: Cardiovascular;  Laterality: N/A;    Current Outpatient Prescriptions  Medication Sig Dispense Refill  . acetaminophen (TYLENOL) 500 MG tablet Take 1,000 mg by mouth every 6 (six) hours as needed for pain.    Marland Kitchen albuterol (PROVENTIL HFA;VENTOLIN HFA) 108 (90 BASE) MCG/ACT inhaler Inhale 1 puff into the lungs every 6 (six) hours as needed for wheezing or shortness of breath.    Marland Kitchen aspirin EC 81 MG EC tablet Take 1 tablet (81 mg total) by mouth daily. 30 tablet 11  . atorvastatin (LIPITOR) 40 MG tablet Take 1 tablet (40 mg total) by mouth every evening. 30 tablet 3  . fenofibrate 160 MG tablet Take 1 tablet (160 mg total) by mouth daily. 30 tablet 3  . lisinopril (PRINIVIL,ZESTRIL) 40 MG tablet Take 40 mg by mouth daily.    . metFORMIN (GLUCOPHAGE) 500 MG tablet Take 500 mg by mouth 2 (two) times daily with a meal.    . metoprolol tartrate (LOPRESSOR) 25 MG tablet Take 0.5 tablets (12.5 mg total) by mouth 2 (two) times daily. 30 tablet 3  . Nicotine 21-14-7 MG/24HR KIT Place 21 mg onto the skin daily.    . nitroGLYCERIN (NITROSTAT) 0.4 MG SL tablet Place 1 tablet (0.4  mg total) under the tongue every 5 (five) minutes as needed for chest pain (up to 3 doses). 25 tablet 3  . omega-3 acid ethyl esters (LOVAZA) 1 G capsule Take 2 capsules (2 g total) by mouth 2 (two) times daily. (Patient not taking: Reported on 11/10/2014) 120 capsule 3   No current facility-administered medications for this visit.    Allergies:   Review of patient's allergies indicates no known allergies.    Social History:  The patient  reports that she has been smoking Cigarettes.  She started smoking about 30 years ago. She has a 43.5 pack-year smoking history. She has never used smokeless tobacco. She reports that she drinks alcohol. She reports that she does not use illicit drugs.   Family  History:  The patient's family history includes Coronary artery disease in her father.    ROS:  Please see the history of present illness. All other systems are reviewed and negative.    PHYSICAL EXAM: VS:  BP 116/84 mmHg  Pulse 71  Ht 5' 4"  (1.626 m)  Wt 196 lb (88.905 kg)  BMI 33.63 kg/m2  SpO2 98%  LMP 10/09/2014 , BMI Body mass index is 33.63 kg/(m^2). GEN: Well nourished, well developed, in no acute distress HEENT: normal Neck: no JVD, carotid bruits, or masses Cardiac: RRR; no murmurs, rubs, or gallops,no edema  Respiratory:  clear to auscultation bilaterally, normal work of breathing GI: soft, nontender, nondistended, + BS MS: no deformity or atrophy Skin: warm and dry, no rash Neuro:  Strength and sensation are intact Psych: euthymic mood, full affect   EKG:  EKG is not ordered today.  Recent Labs: 11/01/2014: ALT 15; B Natriuretic Peptide 13.0; TSH 1.501 11/02/2014: BUN 11; Creatinine 0.68; Hemoglobin 13.7; Platelets 311; Potassium 3.9; Sodium 131*    Lipid Panel    Component Value Date/Time   CHOL 126 11/02/2014 0444   TRIG 471* 11/02/2014 0444   HDL 25* 11/02/2014 0444   CHOLHDL 5.0 11/02/2014 0444   VLDL UNABLE TO CALCULATE IF TRIGLYCERIDE OVER 400 mg/dL 11/02/2014 0444   LDLCALC UNABLE TO CALCULATE IF TRIGLYCERIDE OVER 400 mg/dL 11/02/2014 0444     Wt Readings from Last 3 Encounters:  11/10/14 196 lb (88.905 kg)  11/02/14 196 lb 11.2 oz (89.223 kg)  03/21/14 202 lb (91.627 kg)     Other studies Reviewed: Additional studies/ records that were reviewed today include: cath report, d/c summary.  ASSESSMENT AND PLAN:  1.  Recent NSTEMI - continue current therapy with ASA, BB, statin, ACE, NTG - discussed how to respond to future episodes of chest or arm pain. - discussed importance of [place] lifestyle and medication compliance - OK to increase activity and return to work. - pt thought she was not eligible for jury duty until seen by [place], so requests an  excuse. Advised her that we can clear her for jury duty and return to work as of today, but she should call the courthouse and reschedule the jury duty.   2. Tobacco use - strongly advised her not to smoke while wearing a nicotine patch, but pt states she is still smoking 5-7/day.  - negotiated this, she has about 2 packs of cigarettes left, strongly encouraged her not to buy any more and stop when these are done.  3. Dyslipidemia - encouraged med and dietary compliance - recheck lipids and liver in 2 months, can be done through [facility] if this is easier/cheaper.  4. Anxiety - pt said she was ADD,  but sx description is more consistent with anxiety. - pt admits to this and requests rx for this - advised her this is more appropriate for primary care, as they can follow her for this. - strongly encouraged her to discuss with the [facility] Dept staff, she may need ongoing rx  Current medicines are reviewed at length with the patient today.  The patient has concerns regarding medicines.  The following changes have been made:  no change  Labs/ tests ordered today include:  Orders Placed This Encounter  Procedures  . Lipid Profile  . Hepatic function panel   Disposition:   FU with Dr Domenic Polite in 6 months   Signed, Lenoard Aden  11/10/2014 2:37 PM    [address], [place], [place]  [address] Phone: [phone]; Fax: [phone]

## 2015-03-16 ENCOUNTER — Other Ambulatory Visit (HOSPITAL_COMMUNITY): Payer: Self-pay | Admitting: Nurse Practitioner

## 2015-03-16 DIAGNOSIS — Z1231 Encounter for screening mammogram for malignant neoplasm of breast: Secondary | ICD-10-CM

## 2015-03-22 ENCOUNTER — Ambulatory Visit (HOSPITAL_COMMUNITY): Payer: Self-pay

## 2015-03-24 ENCOUNTER — Ambulatory Visit (HOSPITAL_COMMUNITY)
Admission: RE | Admit: 2015-03-24 | Discharge: 2015-03-24 | Disposition: A | Discharge status: Home / Self Care | Payer: Self-pay | Source: Ambulatory Visit | Attending: Nurse Practitioner | Admitting: Nurse Practitioner

## 2015-03-24 DIAGNOSIS — Z1231 Encounter for screening mammogram for malignant neoplasm of breast: Secondary | ICD-10-CM

## 2016-01-30 ENCOUNTER — Other Ambulatory Visit: Payer: Self-pay

## 2016-01-30 ENCOUNTER — Ambulatory Visit (HOSPITAL_COMMUNITY)
Admission: RE | Admit: 2016-01-30 | Discharge: 2016-01-30 | Disposition: A | Discharge status: Home / Self Care | Payer: Self-pay | Source: Ambulatory Visit | Attending: Emergency Medicine | Admitting: Emergency Medicine

## 2016-01-30 ENCOUNTER — Other Ambulatory Visit (HOSPITAL_COMMUNITY): Payer: Self-pay | Admitting: Emergency Medicine

## 2016-01-30 ENCOUNTER — Emergency Department (HOSPITAL_COMMUNITY)
Admission: EM | Admit: 2016-01-30 | Discharge: 2016-01-30 | Disposition: A | Discharge status: Home / Self Care | Payer: Self-pay | Attending: Emergency Medicine | Admitting: Emergency Medicine

## 2016-01-30 DIAGNOSIS — E785 Hyperlipidemia, unspecified: Secondary | ICD-10-CM | POA: Insufficient documentation

## 2016-01-30 DIAGNOSIS — E119 Type 2 diabetes mellitus without complications: Secondary | ICD-10-CM | POA: Insufficient documentation

## 2016-01-30 DIAGNOSIS — I2510 Atherosclerotic heart disease of native coronary artery without angina pectoris: Secondary | ICD-10-CM | POA: Insufficient documentation

## 2016-01-30 DIAGNOSIS — F17210 Nicotine dependence, cigarettes, uncomplicated: Secondary | ICD-10-CM | POA: Insufficient documentation

## 2016-01-30 DIAGNOSIS — R1013 Epigastric pain: Secondary | ICD-10-CM

## 2016-01-30 DIAGNOSIS — Z79899 Other long term (current) drug therapy: Secondary | ICD-10-CM | POA: Insufficient documentation

## 2016-01-30 DIAGNOSIS — R1084 Generalized abdominal pain: Secondary | ICD-10-CM | POA: Insufficient documentation

## 2016-01-30 DIAGNOSIS — K5790 Diverticulosis of intestine, part unspecified, without perforation or abscess without bleeding: Secondary | ICD-10-CM | POA: Insufficient documentation

## 2016-01-30 DIAGNOSIS — R918 Other nonspecific abnormal finding of lung field: Secondary | ICD-10-CM | POA: Insufficient documentation

## 2016-01-30 DIAGNOSIS — Z7984 Long term (current) use of oral hypoglycemic drugs: Secondary | ICD-10-CM | POA: Insufficient documentation

## 2016-01-30 DIAGNOSIS — I10 Essential (primary) hypertension: Secondary | ICD-10-CM | POA: Insufficient documentation

## 2016-01-30 DIAGNOSIS — I700 Atherosclerosis of aorta: Secondary | ICD-10-CM | POA: Insufficient documentation

## 2016-01-30 DIAGNOSIS — R52 Pain, unspecified: Secondary | ICD-10-CM

## 2016-01-30 DIAGNOSIS — Z9049 Acquired absence of other specified parts of digestive tract: Secondary | ICD-10-CM | POA: Insufficient documentation

## 2016-01-30 DIAGNOSIS — K2970 Gastritis, unspecified, without bleeding: Secondary | ICD-10-CM | POA: Insufficient documentation

## 2016-01-30 DIAGNOSIS — Z7982 Long term (current) use of aspirin: Secondary | ICD-10-CM | POA: Insufficient documentation

## 2016-01-30 MED ORDER — IOPAMIDOL (ISOVUE-300) INJECTION 61%
100.0000 mL | Freq: Once | INTRAVENOUS | Status: AC | PRN
  Administered 2016-01-30: 100 mL via INTRAVENOUS

## 2016-01-30 NOTE — ED Notes (Signed)
Patient denies pain and is resting comfortably.  

## 2016-01-30 NOTE — ED Provider Notes (Signed)
TIME SEEN: 1:35 AM  CHIEF COMPLAINT: epigastric pain  HPI: Pt is a 45 yo F with history of hypertension, diabetes, tobacco use, history of MI he does not have stents who presents to the emergency department epigastric abdominal pain has been constant for the past 4 days. Describes it as a dull, pressure-like pain without radiation. She states it is just under her "chest pain". Denies that radiates into her chest and denies chest shortness of breath. States when she had her prior heart attack her pain seemed to be higher in her chest and this is different. She has had some nausea but no vomiting. No diaphoresis or dizziness. Pain is not exertional, pleuritic or related to food. She does not note any aggravating or relieving factors. States she thought she could be constipated and tried drinking prune juice, taking Ex-Lax and stool softeners. States she is now having very watery stools. No bloody stools. Does state she has had black stools but did drink Pepto-Bismol earlier.  Denies that she has had fever, chills, cough. Denies heavy alcohol use, heavy NSAID use. Patient is status post cholecystectomy and previous C-section. She is on Zantac twice a day. No history of PE or DVT. No history of peptic ulcer disease. No history of endoscopy.  ROS: See HPI Constitutional: no fever  Eyes: no drainage  ENT: no runny nose   Cardiovascular:  no chest pain  Resp: no SOB  GI: no vomiting GU: no dysuria Integumentary: no rash  Allergy: no hives  Musculoskeletal: no leg swelling  Neurological: no slurred speech ROS otherwise negative   PAST MEDICAL HISTORY/PAST SURGICAL HISTORY:  Past Medical History:  Diagnosis Date  . CAD (coronary artery disease)    a. NSTEMI (peak trop 0.06) 10/2014 - cath showing first diagonal with 50-70% ostial narrowing, small in distribution, widely patent LAD/LCx/RCA, EF normal.  . Diabetes mellitus with complication   . Dyslipidemia   . Hypertension   . Hypertriglyceridemia    . Obesity   . Tobacco abuse     MEDICATIONS:  Prior to Admission medications   Medication Sig Start Date End Date Taking? Authorizing Provider  acetaminophen (TYLENOL) 500 MG tablet Take 1,000 mg by mouth every 6 (six) hours as needed for pain.    Historical Provider, MD  albuterol (PROVENTIL HFA;VENTOLIN HFA) 108 (90 BASE) MCG/ACT inhaler Inhale 1 puff into the lungs every 6 (six) hours as needed for wheezing or shortness of breath.    Historical Provider, MD  aspirin EC 81 MG EC tablet Take 1 tablet (81 mg total) by mouth daily. 11/03/14   Dayna N Dunn, PA-C  atorvastatin (LIPITOR) 40 MG tablet Take 1 tablet (40 mg total) by mouth every evening. 11/02/14   Dayna N Dunn, PA-C  fenofibrate 160 MG tablet Take 1 tablet (160 mg total) by mouth daily. 11/02/14   Dayna N Dunn, PA-C  lisinopril (PRINIVIL,ZESTRIL) 40 MG tablet Take 40 mg by mouth daily.    Historical Provider, MD  metFORMIN (GLUCOPHAGE) 500 MG tablet Take 500 mg by mouth 2 (two) times daily with a meal.    Historical Provider, MD  metoprolol tartrate (LOPRESSOR) 25 MG tablet Take 0.5 tablets (12.5 mg total) by mouth 2 (two) times daily. 11/02/14   Dayna N Dunn, PA-C  Nicotine 21-14-7 MG/24HR KIT Place 21 mg onto the skin daily.    Historical Provider, MD  nitroGLYCERIN (NITROSTAT) 0.4 MG SL tablet Place 1 tablet (0.4 mg total) under the tongue every 5 (five) minutes as needed  for chest pain (up to 3 doses). 11/02/14   Dayna N Dunn, PA-C  omega-3 acid ethyl esters (LOVAZA) 1 G capsule Take 2 capsules (2 g total) by mouth 2 (two) times daily. Patient not taking: Reported on 11/10/2014 11/02/14   Dayna N Dunn, PA-C    ALLERGIES:  No Known Allergies  SOCIAL HISTORY:  Social History  Substance Use Topics  . Smoking status: Current Every Day Smoker    Packs/day: 1.50    Years: 29.00    Types: Cigarettes    Start date: 11/09/1984  . Smokeless tobacco: Never Used  . Alcohol use 0.0 oz/week     Comment: once a month    FAMILY  HISTORY: Family History  Problem Relation Age of Onset  . Coronary artery disease Father      EXAM: VITALS:  130/82, HR 92, RR 22, Sat 98% on RA, Temp 98.1 Oral CONSTITUTIONAL: Alert and oriented and responds appropriately to questions. Well-appearing; well-nourished HEAD: Normocephalic EYES: Conjunctivae clear, PERRL ENT: normal nose; no rhinorrhea; moist mucous membranes NECK: Supple, no meningismus, no LAD  CARD: RRR; S1 and S2 appreciated; no murmurs, no clicks, no rubs, no gallops RESP: Normal chest excursion without splinting or tachypnea; breath sounds clear and equal bilaterally; no wheezes, no rhonchi, no rales, no hypoxia or respiratory distress, speaking full sentences ABD/GI: Normal bowel sounds; non-distended; soft, tender to palpation in the epigastric region, no rebound, no guarding, no peritoneal signs BACK:  The back appears normal and is non-tender to palpation, there is no CVA tenderness EXT: Normal ROM in all joints; non-tender to palpation; no edema; normal capillary refill; no cyanosis, no calf tenderness or swelling    SKIN: Normal color for age and race; warm; no rash NEURO: Moves all extremities equally, sensation to light touch intact diffusely, cranial nerves II through XII intact PSYCH: The patient's mood and manner are appropriate. Grooming and personal hygiene are appropriate.  MEDICAL DECISION MAKING: Pt here with constant epigastric pain for 4 days. EKG shows no ischemic abnormality. She states this feels different than her prior MI. States her last cardiac catheterization was in April 2016. She does have risk factors for ACS. Will obtain a troponin. Will also obtain abdominal labs, urine. Will give Protonix, Zofran, IV fluids, GI cocktail and reassess.  ED PROGRESS: 2:40 AM  Pt's labs show a leukocytosis of 18,000 with left shift. Hemoglobin 13.2. Electrolytes within normal limits. Glucose mildly elevated at 255 but not in DKA. Alkaline phosphatase 67, AST  19, ALT 30, lipase 44, total bilirubin 0.1. Troponin is negative. Urine shows blood but no other sign of infection. Urine pregnancy is negative.  Patient reports that this leukocytosis is chronic for her. It does appear she has had intermittent leukocytosis before but her last white blood cell count on 11/02/2014 was normal at 9.9. She is still having significant pain despite GI cocktail. We'll give morphine and obtain a CT of her abdomen and pelvis for further evaluation. X-ray showed no acute abnormality.  4:40 AM  Pt reports pain improved after morphine. CT scan is unremarkable except for diverticulosis without diverticulitis. Discussed with patient that this could be GERD, gastritis, peptic ulcer disease. No sign of perforation. I feel she should follow up with gastroenterology as an outpatient. She is comfortable with this plan. Will discharge on Protonix and with Carafate and Zofran. Have advised her to avoid NSAIDs, alcohol, spicy, greasy, acidic foods and eat a bland diet for the next several days. Discussed with her return  precautions. She verbalized understanding and is comfortable with this plan. This is ACS given she has had constant pain for 4 days and has a negative troponin.   Date: 01/30/2016 1:27 AM  Rate: 89  Rhythm: normal sinus rhythm  QRS Axis: normal  Intervals: normal  ST/T Wave abnormalities: normal  Conduction Disutrbances: none  Narrative Interpretation: unremarkable   At this time, I do not feel there is any life-threatening condition present. I have reviewed and discussed all results (EKG, imaging, lab, urine as appropriate), exam findings with patient. I have reviewed nursing notes and appropriate previous records.  I feel the patient is safe to be discharged home without further emergent workup. Discussed usual and customary return precautions. Patient and family (if present) verbalize understanding and are comfortable with this plan.  Patient will follow-up with their  primary care provider. If they do not have a primary care provider, information for follow-up has been provided to them. All questions have been answered.        [place], DO 01/30/16 [phone]

## 2016-02-01 LAB — CBC WITH DIFFERENTIAL/PLATELET
Basophils Absolute: 0 10*3/uL (ref 0.0–0.1)
Basophils Relative: 0 %
Eosinophils Absolute: 0.2 10*3/uL (ref 0.0–0.7)
Eosinophils Relative: 1 %
HEMATOCRIT: 38.9 % (ref 36.0–46.0)
Hemoglobin: 13.2 g/dL (ref 12.0–15.0)
Lymphocytes Relative: 21 %
Lymphs Abs: 3.8 10*3/uL (ref 0.7–4.0)
MCH: 30.3 pg (ref 26.0–34.0)
MCHC: 33.9 g/dL (ref 30.0–36.0)
MCV: 89.2 fL (ref 78.0–100.0)
MONO ABS: 1.1 10*3/uL — AB (ref 0.1–1.0)
MONOS PCT: 6 %
Neutro Abs: 13.5 10*3/uL — ABNORMAL HIGH (ref 1.7–7.7)
Neutrophils Relative %: 72 %
Platelets: 412 10*3/uL — ABNORMAL HIGH (ref 150–400)
RBC: 4.36 MIL/uL (ref 3.87–5.11)
RDW: 12.9 % (ref 11.5–15.5)
WBC: 18.6 10*3/uL — ABNORMAL HIGH (ref 4.0–10.5)

## 2016-02-01 LAB — URINALYSIS, ROUTINE W REFLEX MICROSCOPIC
Bilirubin Urine: NEGATIVE
Glucose, UA: 500 mg/dL — AB
KETONES UR: NEGATIVE mg/dL
Leukocytes, UA: NEGATIVE
Nitrite: NEGATIVE
PROTEIN: NEGATIVE mg/dL
Specific Gravity, Urine: >1.03 — ABNORMAL HIGH (ref 1.005–1.030)
pH: 5.5 (ref 5.0–8.0)

## 2016-02-01 LAB — COMPREHENSIVE METABOLIC PANEL
ALT: 30 U/L (ref 14–54)
ANION GAP: 6 (ref 5–15)
AST: 19 U/L (ref 15–41)
Albumin: 4 g/dL (ref 3.5–5.0)
Alkaline Phosphatase: 67 U/L (ref 38–126)
BILIRUBIN TOTAL: 0.1 mg/dL — AB (ref 0.3–1.2)
BUN: 11 mg/dL (ref 6–20)
CO2: 21 mmol/L — ABNORMAL LOW (ref 22–32)
Calcium: 8.7 mg/dL — ABNORMAL LOW (ref 8.9–10.3)
Chloride: 106 mmol/L (ref 101–111)
Creatinine, Ser: 0.72 mg/dL (ref 0.44–1.00)
GFR calc Af Amer: >60 mL/min (ref >60)
GFR calc non Af Amer: >60 mL/min (ref >60)
GLUCOSE: 255 mg/dL — AB (ref 65–99)
POTASSIUM: 3.4 mmol/L — AB (ref 3.5–5.1)
SODIUM: 133 mmol/L — AB (ref 135–145)
TOTAL PROTEIN: 7.4 g/dL (ref 6.5–8.1)

## 2016-02-01 LAB — PREGNANCY, URINE: Preg Test, Ur: NEGATIVE

## 2016-02-01 LAB — URINE MICROSCOPIC-ADD ON

## 2016-02-01 LAB — TROPONIN I

## 2016-02-01 LAB — LIPASE, BLOOD: Lipase: 44 U/L (ref 11–51)

## 2016-10-13 ENCOUNTER — Encounter (HOSPITAL_COMMUNITY): Payer: Self-pay

## 2016-10-13 ENCOUNTER — Emergency Department (HOSPITAL_COMMUNITY)
Admission: EM | Admit: 2016-10-13 | Discharge: 2016-10-13 | Disposition: A | Discharge status: Home / Self Care | Payer: Self-pay | Attending: Emergency Medicine | Admitting: Emergency Medicine

## 2016-10-13 DIAGNOSIS — F17210 Nicotine dependence, cigarettes, uncomplicated: Secondary | ICD-10-CM | POA: Insufficient documentation

## 2016-10-13 DIAGNOSIS — E119 Type 2 diabetes mellitus without complications: Secondary | ICD-10-CM | POA: Insufficient documentation

## 2016-10-13 DIAGNOSIS — I10 Essential (primary) hypertension: Secondary | ICD-10-CM | POA: Insufficient documentation

## 2016-10-13 DIAGNOSIS — I252 Old myocardial infarction: Secondary | ICD-10-CM | POA: Insufficient documentation

## 2016-10-13 DIAGNOSIS — Z7982 Long term (current) use of aspirin: Secondary | ICD-10-CM | POA: Insufficient documentation

## 2016-10-13 DIAGNOSIS — N3001 Acute cystitis with hematuria: Secondary | ICD-10-CM | POA: Insufficient documentation

## 2016-10-13 DIAGNOSIS — I2510 Atherosclerotic heart disease of native coronary artery without angina pectoris: Secondary | ICD-10-CM | POA: Insufficient documentation

## 2016-10-13 DIAGNOSIS — Z7984 Long term (current) use of oral hypoglycemic drugs: Secondary | ICD-10-CM | POA: Insufficient documentation

## 2016-10-13 DIAGNOSIS — Z79899 Other long term (current) drug therapy: Secondary | ICD-10-CM | POA: Insufficient documentation

## 2016-10-13 LAB — URINALYSIS, ROUTINE W REFLEX MICROSCOPIC
Bacteria, UA: NONE SEEN
Bilirubin Urine: NEGATIVE
Glucose, UA: NEGATIVE mg/dL
Ketones, ur: NEGATIVE mg/dL
Nitrite: NEGATIVE
PROTEIN: 30 mg/dL — AB
Specific Gravity, Urine: 1.021 (ref 1.005–1.030)
pH: 6 (ref 5.0–8.0)

## 2016-10-13 LAB — PREGNANCY, URINE: Preg Test, Ur: NEGATIVE

## 2016-10-13 MED ORDER — CEPHALEXIN 500 MG PO CAPS
500.0000 mg | ORAL_CAPSULE | Freq: Once | ORAL | Status: AC
  Administered 2016-10-13: 500 mg via ORAL
  Filled 2016-10-13: qty 1

## 2016-10-13 MED ORDER — PHENAZOPYRIDINE HCL 100 MG PO TABS
200.0000 mg | ORAL_TABLET | Freq: Once | ORAL | Status: AC
  Administered 2016-10-13: 200 mg via ORAL
  Filled 2016-10-13: qty 2

## 2016-10-13 MED ORDER — PHENAZOPYRIDINE HCL 200 MG PO TABS: 200.0000 mg | ORAL_TABLET | Freq: Three times a day (TID) | ORAL | 0 refills | Status: DC | PRN

## 2016-10-13 MED ORDER — CEPHALEXIN 500 MG PO CAPS: 500.0000 mg | ORAL_CAPSULE | Freq: Three times a day (TID) | ORAL | 0 refills | Status: DC

## 2016-10-13 NOTE — Discharge Instructions (Signed)
Drink plenty of fluids. Take the antibiotic until gone. Take the pyridium for the discomfort. Recheck if you get a fever, vomiting, pain in your back or if you aren't improving over the next 48 hours.

## 2016-10-13 NOTE — ED Provider Notes (Signed)
[facility] DEPT Provider Note   CSN: [id number] Arrival date & time: 10/13/16  0238  Time seen 02:55 AM   History   Chief Complaint Chief Complaint  Patient presents with  . Urinary pressure    HPI Alyssa Morales is a 46 y.o. female.  HPI  patient reports she was fine all day. She states after she got home from work around 9 PM she went to the bathroom to urinate and she started having a pressure feeling in her lower abdomen and states her bladder feels full. She reports she is having frequency and urgency and she is dribbling. She had some mild nausea without vomiting. She denies fever or hematuria. She denies any back or flank pain. She states she's never had this before. 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  PCP [employer]   Past Medical History:  Diagnosis Date  . CAD (coronary artery disease)    a. NSTEMI (peak trop 0.06) 10/2014 - cath showing first diagonal with 50-70% ostial narrowing, small in distribution, widely patent LAD/LCx/RCA, EF normal.  . Diabetes mellitus with complication ([place])   . Dyslipidemia   . Hypertension   . Hypertriglyceridemia   . Obesity   . Tobacco abuse     Patient Active Problem List   Diagnosis Date Noted  . Essential hypertension 11/02/2014  . Hyposmolality and/or hyponatremia 11/02/2014  . Hypertriglyceridemia 11/02/2014  . Leukocytosis 11/02/2014  . NSTEMI (non-ST elevated myocardial infarction) ([place]) 11/01/2014  . Obesity 11/01/2014  . Tobacco abuse 11/01/2014  . Diabetes mellitus, type 2 ([place]) 11/01/2014    Past Surgical History:  Procedure Laterality Date  . CESAREAN SECTION    . CHOLECYSTECTOMY    . LEFT HEART CATHETERIZATION WITH CORONARY ANGIOGRAM N/A 11/02/2014   Procedure: LEFT HEART CATHETERIZATION WITH CORONARY ANGIOGRAM;  Surgeon: Belva Crome, MD;  Location: [facility] CATH LAB;  Service: Cardiovascular;  Laterality: N/A;    OB History    Gravida Para Term Preterm  AB Living   3 3 3     3    SAB TAB Ectopic Multiple Live Births                   Home Medications    Prior to Admission medications   Medication Sig Start Date End Date Taking? Authorizing Provider  acetaminophen (TYLENOL) 500 MG tablet Take 1,000 mg by mouth every 6 (six) hours as needed for pain.    Historical Provider, MD  albuterol (PROVENTIL HFA;VENTOLIN HFA) 108 (90 BASE) MCG/ACT inhaler Inhale 1 puff into the lungs every 6 (six) hours as needed for wheezing or shortness of breath.    Historical Provider, MD  aspirin EC 81 MG EC tablet Take 1 tablet (81 mg total) by mouth daily. 11/03/14   Dayna N Dunn, PA-C  atorvastatin (LIPITOR) 40 MG tablet Take 1 tablet (40 mg total) by mouth every evening. 11/02/14   Dayna N Dunn, PA-C  cephALEXin (KEFLEX) 500 MG capsule Take 1 capsule (500 mg total) by mouth 3 (three) times daily. 10/13/16   Rolland Porter, MD  fenofibrate 160 MG tablet Take 1 tablet (160 mg total) by mouth daily. 11/02/14   Dayna N Dunn, PA-C  lisinopril (PRINIVIL,ZESTRIL) 40 MG tablet Take 40 mg by mouth daily.    Historical Provider, MD  metFORMIN (GLUCOPHAGE) 500 MG tablet Take 500 mg by mouth 2 (two) times daily with a meal.    Historical Provider, MD  metoprolol tartrate (LOPRESSOR) 25 MG tablet Take 0.5 tablets (12.5 mg total) by mouth 2 (two) times daily. 11/02/14   Dayna N Dunn, PA-C  Nicotine 21-14-7 MG/24HR KIT Place 21 mg onto the skin daily.    Historical Provider, MD  nitroGLYCERIN (NITROSTAT) 0.4 MG SL tablet Place 1 tablet (0.4 mg total) under the tongue every 5 (five) minutes as needed for chest pain (up to 3 doses). 11/02/14   Dayna N Dunn, PA-C  omega-3 acid ethyl esters (LOVAZA) 1 G capsule Take 2 capsules (2 g total) by mouth 2 (two) times daily. Patient not taking: Reported on 11/10/2014 11/02/14   Dayna N Dunn, PA-C  phenazopyridine (PYRIDIUM) 200 MG tablet Take 1 tablet (200 mg total) by mouth 3 (three) times daily as needed for pain (pain on urination). 10/13/16   Rolland Porter, MD    Family History Family History  Problem Relation Age of Onset  . Coronary artery disease Father     Social History Social History  Substance Use Topics  . Smoking status: Current Every Day Smoker    Packs/day: 1.50    Years: 29.00    Types: Cigarettes    Start date: 11/09/1984  . Smokeless tobacco: Never Used  . Alcohol use 0.0 oz/week     Comment: once a month  employed Smokes 1 1/2 ppd down from 2 ppd   Allergies   Patient has no known allergies.   Review of Systems Review of Systems  All other systems reviewed and are negative.    Physical Exam Updated Vital Signs BP (!) 146/84 (BP Location: Right Arm)   Pulse 79   Temp 98.7 F (37.1 C) (Oral)   Resp 20   Ht 5' 4"  (1.626 m)   Wt 205 lb (93 kg)   LMP 10/01/2016   SpO2 100%   BMI 35.19 kg/m   Vital signs normal    Physical Exam  Constitutional: She is oriented to person, place, and time. She appears well-developed and well-nourished.  Non-toxic appearance. She does not appear ill. No distress.  HENT:  Head: Normocephalic and atraumatic.  Right Ear: External ear normal.  Left Ear: External ear normal.  Nose: Nose normal. No mucosal edema or rhinorrhea.  Mouth/Throat: Oropharynx is clear and moist and mucous membranes are normal. No dental abscesses or uvula swelling.  Eyes: Conjunctivae and EOM are normal. Pupils are equal, round, and reactive to light.  Neck: Normal range of motion and full passive range of motion without pain. Neck supple.  Cardiovascular: Normal rate, regular rhythm and normal heart sounds.  Exam reveals no gallop and no friction rub.   No murmur heard. Pulmonary/Chest: Effort normal and breath sounds normal. No respiratory distress. She has no wheezes. She has no rhonchi. She has no rales. She exhibits no tenderness and no crepitus.  Abdominal: Soft. Normal appearance and bowel sounds are normal. She exhibits no distension. There is tenderness. There is no rebound and no  guarding.    Musculoskeletal: Normal range of motion. She exhibits no edema or tenderness.  Moves all extremities well.   Neurological: She is alert and oriented to person, place, and time. She has normal strength. No cranial nerve deficit.  Skin: Skin is warm, dry and intact. No rash noted. No erythema. No pallor.  Psychiatric: She has a normal mood and affect. Her speech is normal and behavior is normal. Her mood appears not anxious.  Nursing note and vitals reviewed.    ED Treatments / Results  Labs (all labs ordered are listed, but only abnormal results are displayed) Results for orders placed or performed during the hospital encounter of 10/13/16  Urinalysis, Routine w reflex microscopic  Result Value Ref Range   Color, Urine YELLOW YELLOW   APPearance CLOUDY (A) CLEAR   Specific Gravity, Urine 1.021 1.005 - 1.030   pH 6.0 5.0 - 8.0   Glucose, UA NEGATIVE NEGATIVE mg/dL   Hgb urine dipstick LARGE (A) NEGATIVE   Bilirubin Urine NEGATIVE NEGATIVE   Ketones, ur NEGATIVE NEGATIVE mg/dL   Protein, ur 30 (A) NEGATIVE mg/dL   Nitrite NEGATIVE NEGATIVE   Leukocytes, UA LARGE (A) NEGATIVE   RBC / HPF TOO NUMEROUS TO COUNT 0 - 5 RBC/hpf   WBC, UA TOO NUMEROUS TO COUNT 0 - 5 WBC/hpf   Bacteria, UA NONE SEEN NONE SEEN   Squamous Epithelial / LPF 0-5 (A) NONE SEEN   WBC Clumps PRESENT    Mucous PRESENT    Budding Yeast PRESENT   Pregnancy, urine  Result Value Ref Range   Preg Test, Ur NEGATIVE NEGATIVE   Laboratory interpretation all normal except UTI    EKG  EKG Interpretation None       Radiology No results found.  Procedures Procedures (including critical care time)  Medications Ordered in ED Medications  cephALEXin (KEFLEX) capsule 500 mg (not administered)  phenazopyridine (PYRIDIUM) tablet 200 mg (200 mg Oral Given 10/13/16 0325)     Initial Impression / Assessment and Plan / ED Course  I have reviewed the triage vital signs and the nursing  notes.  Pertinent labs & imaging results that were available during my care of the patient were reviewed by me and considered in my medical decision making (see chart for details).  Pt was given pyridium to take while waiting for her UA to results.  Bladder scan shows zero so she is not having urinary retention. She does not have any back or flank pain so CT scan to look for  kidney stone not done. No fever or flank pain so no concern for pyelonephritis at this time.   After reviewing her UA she was started on keflex orally. She was given her UA results. She should return for fever, vomiting or worsening symptoms.   Final Clinical Impressions(s) / ED Diagnoses   Final diagnoses:  Acute cystitis with hematuria    New Prescriptions New Prescriptions   CEPHALEXIN (KEFLEX) 500 MG CAPSULE    Take 1 capsule (500 mg total) by mouth 3 (three) times daily.   PHENAZOPYRIDINE (PYRIDIUM) 200 MG TABLET    Take 1 tablet (200 mg total) by mouth 3 (three) times daily as needed for pain (pain on urination).    Plan discharge  Rolland Porter, MD, Barbette Or, MD 10/13/16 [phone]

## 2016-10-13 NOTE — ED Triage Notes (Signed)
Pt reports sudden onset of urinary pressure, states she feels full with pressure to urinate.

## 2017-01-24 ENCOUNTER — Emergency Department (HOSPITAL_COMMUNITY)
Admission: EM | Admit: 2017-01-24 | Discharge: 2017-01-24 | Disposition: A | Discharge status: Home / Self Care | Payer: Self-pay | Attending: Emergency Medicine | Admitting: Emergency Medicine

## 2017-01-24 ENCOUNTER — Encounter (HOSPITAL_COMMUNITY): Payer: Self-pay | Admitting: Cardiology

## 2017-01-24 DIAGNOSIS — Y939 Activity, unspecified: Secondary | ICD-10-CM | POA: Insufficient documentation

## 2017-01-24 DIAGNOSIS — F17210 Nicotine dependence, cigarettes, uncomplicated: Secondary | ICD-10-CM | POA: Insufficient documentation

## 2017-01-24 DIAGNOSIS — Z79899 Other long term (current) drug therapy: Secondary | ICD-10-CM | POA: Insufficient documentation

## 2017-01-24 DIAGNOSIS — Y999 Unspecified external cause status: Secondary | ICD-10-CM | POA: Insufficient documentation

## 2017-01-24 DIAGNOSIS — E119 Type 2 diabetes mellitus without complications: Secondary | ICD-10-CM | POA: Insufficient documentation

## 2017-01-24 DIAGNOSIS — Z7984 Long term (current) use of oral hypoglycemic drugs: Secondary | ICD-10-CM | POA: Insufficient documentation

## 2017-01-24 DIAGNOSIS — I2510 Atherosclerotic heart disease of native coronary artery without angina pectoris: Secondary | ICD-10-CM | POA: Insufficient documentation

## 2017-01-24 DIAGNOSIS — S0502XA Injury of conjunctiva and corneal abrasion without foreign body, left eye, initial encounter: Secondary | ICD-10-CM | POA: Insufficient documentation

## 2017-01-24 DIAGNOSIS — W228XXA Striking against or struck by other objects, initial encounter: Secondary | ICD-10-CM | POA: Insufficient documentation

## 2017-01-24 DIAGNOSIS — Y929 Unspecified place or not applicable: Secondary | ICD-10-CM | POA: Insufficient documentation

## 2017-01-24 DIAGNOSIS — I10 Essential (primary) hypertension: Secondary | ICD-10-CM | POA: Insufficient documentation

## 2017-01-24 DIAGNOSIS — Z7982 Long term (current) use of aspirin: Secondary | ICD-10-CM | POA: Insufficient documentation

## 2017-01-24 MED ORDER — TOBRAMYCIN 0.3 % OP SOLN
1.0000 [drp] | Freq: Once | OPHTHALMIC | Status: AC
  Administered 2017-01-24: 1 [drp] via OPHTHALMIC
  Filled 2017-01-24: qty 5

## 2017-01-24 MED ORDER — FLUORESCEIN SODIUM 0.6 MG OP STRP
1.0000 | ORAL_STRIP | Freq: Once | OPHTHALMIC | Status: AC
  Administered 2017-01-24: 1 via OPHTHALMIC
  Filled 2017-01-24: qty 1

## 2017-01-24 MED ORDER — KETOROLAC TROMETHAMINE 0.5 % OP SOLN
1.0000 [drp] | Freq: Once | OPHTHALMIC | Status: AC
  Administered 2017-01-24: 1 [drp] via OPHTHALMIC

## 2017-01-24 MED ORDER — TETRACAINE HCL 0.5 % OP SOLN
2.0000 [drp] | Freq: Once | OPHTHALMIC | Status: AC
  Administered 2017-01-24: 2 [drp] via OPHTHALMIC
  Filled 2017-01-24: qty 4

## 2017-01-24 NOTE — ED Provider Notes (Signed)
[name] DEPT Provider Note   CSN: [id number] Arrival date & time: 01/24/17  1539     History   Chief Complaint Chief Complaint  Patient presents with  . Eye Problem    HPI Alyssa Morales is a 46 y.o. female.  HPI   Alyssa Morales is a 46 y.o. female who presents to the Emergency Department complaining of left eye pain and foreign body sensation that began this morning.  States that she felt she may have gotten a hair in her eye.  She admits to rubbing her eye frequently.  She has flushed her eye with tap water and applied one drop of a prescription eye drop that contained neomycin.  She describes a sharp pain to her eye associated with blinking, photophobia and excessive tearing.  Mild frontal headache of gradual onset since the eye pain.  Denies recent illness, fever, dizziness, and visual loss.     Past Medical History:  Diagnosis Date  . CAD (coronary artery disease)    a. NSTEMI (peak trop 0.06) 10/2014 - cath showing first diagonal with 50-70% ostial narrowing, small in distribution, widely patent LAD/LCx/RCA, EF normal.  . Diabetes mellitus with complication ([place])   . Dyslipidemia   . Hypertension   . Hypertriglyceridemia   . Obesity   . Tobacco abuse     Patient Active Problem List   Diagnosis Date Noted  . Essential hypertension 11/02/2014  . Hyposmolality and/or hyponatremia 11/02/2014  . Hypertriglyceridemia 11/02/2014  . Leukocytosis 11/02/2014  . NSTEMI (non-ST elevated myocardial infarction) ([place]) 11/01/2014  . Obesity 11/01/2014  . Tobacco abuse 11/01/2014  . Diabetes mellitus, type 2 ([place]) 11/01/2014    Past Surgical History:  Procedure Laterality Date  . CESAREAN SECTION    . CHOLECYSTECTOMY    . LEFT HEART CATHETERIZATION WITH CORONARY ANGIOGRAM N/A 11/02/2014   Procedure: LEFT HEART CATHETERIZATION WITH CORONARY ANGIOGRAM;  Surgeon: Belva Crome, MD;  Location: [facility] CATH LAB;  Service: Cardiovascular;  Laterality: N/A;    OB History    Gravida Para Term Preterm AB Living   _0 SAB TAB Ectopic Multiple Live Births                   Home Medications    Prior to Admission medications   Medication Sig Start Date End Date Taking? Authorizing Provider  acetaminophen (TYLENOL) 500 MG tablet Take 1,000 mg by mouth every 6 (six) hours as needed for pain.    [provider]  albuterol (PROVENTIL HFA;VENTOLIN HFA) 108 (90 BASE) MCG/ACT inhaler Inhale 1 puff into the lungs every 6 (six) hours as needed for wheezing or shortness of breath.    [provider]  aspirin EC 81 MG EC tablet Take 1 tablet (81 mg total) by mouth daily. 11/03/14   Dunn, Nedra Hai, PA-C  atorvastatin (LIPITOR) 40 MG tablet Take 1 tablet (40 mg total) by mouth every evening. 11/02/14   Dunn, Nedra Hai, PA-C  cephALEXin (KEFLEX) 500 MG capsule Take 1 capsule (500 mg total) by mouth 3 (three) times daily. 10/13/16   Rolland Porter, MD  fenofibrate 160 MG tablet Take 1 tablet (160 mg total) by mouth daily. 11/02/14   Dunn, Nedra Hai, PA-C  lisinopril (PRINIVIL,ZESTRIL) 40 MG tablet Take 40 mg by mouth daily.    [provider]  metFORMIN (GLUCOPHAGE) 500 MG tablet Take 500 mg by mouth 2 (two) times daily with a meal.  [provider]  metoprolol tartrate (LOPRESSOR) 25 MG tablet Take 0.5 tablets (12.5 mg total) by mouth 2 (two) times daily. 11/02/14   Charlie Pitter, PA-C  Nicotine 21-14-7 MG/24HR KIT Place 21 mg onto the skin daily.    [provider]  nitroGLYCERIN (NITROSTAT) 0.4 MG SL tablet Place 1 tablet (0.4 mg total) under the tongue every 5 (five) minutes as needed for chest pain (up to 3 doses). 11/02/14   Dunn, Nedra Hai, PA-C  omega-3 acid ethyl esters (LOVAZA) 1 G capsule Take 2 capsules (2 g total) by mouth 2 (two) times daily. Patient not taking: Reported on 11/10/2014 11/02/14   Charlie Pitter, PA-C  phenazopyridine (PYRIDIUM) 200 MG tablet Take 1 tablet (200 mg total) by mouth 3 (three) times daily as needed for  pain (pain on urination). 10/13/16   Rolland Porter, MD    Family History Family History  Problem Relation Age of Onset  . Coronary artery disease Father     Social History Social History  Substance Use Topics  . Smoking status: Current Every Day Smoker    Packs/day: 1.50    Years: 29.00    Types: Cigarettes    Start date: 11/09/1984  . Smokeless tobacco: Never Used  . Alcohol use 0.0 oz/week     Comment: once a month     Allergies   Patient has no known allergies.   Review of Systems Review of Systems  Constitutional: Negative for chills and fever.  HENT: Negative for congestion and sinus pain.   Eyes: Positive for photophobia, pain and redness. Negative for visual disturbance.  Respiratory: Negative for cough.   Gastrointestinal: Negative for nausea and vomiting.  Musculoskeletal: Negative for neck pain and neck stiffness.  Skin: Negative for rash.  Neurological: Positive for headaches. Negative for dizziness, speech difficulty, weakness, light-headedness and numbness.  Psychiatric/Behavioral: Negative for confusion.     Physical Exam Updated Vital Signs BP (!) 152/95   Pulse 87   Temp 98.3 F (36.8 C) (Oral)   Resp 16   Ht 5' 4.5" (1.638 m)   Wt 89.7 kg (197 lb 12.8 oz)   SpO2 97%   BMI 33.43 kg/m   Physical Exam  Constitutional: She is oriented to person, place, and time. She appears well-developed and well-nourished. No distress.  HENT:  Head: Normocephalic and atraumatic.  Eyes: Pupils are equal, round, and reactive to light. EOM are normal. Lids are everted and swept, no foreign bodies found. Left eye exhibits no chemosis, no exudate and no hordeolum. No foreign body present in the left eye. Right conjunctiva is not injected. Left conjunctiva is injected. Left conjunctiva has no hemorrhage.  Fundoscopic exam:      The left eye shows no papilledema.  Slit lamp exam:      The left eye shows corneal abrasion and fluorescein uptake. The left eye shows no  corneal flare, no corneal ulcer, no foreign body and no hyphema.    Two small corneal abrasions at the 5 o'clock position.  Slit lamp exam reveals negative Seidel's sign, no hyphema or FB   Neck: Normal range of motion. Neck supple. No thyromegaly present.  Cardiovascular: Normal rate, regular rhythm, normal heart sounds and intact distal pulses.   No murmur heard. Pulmonary/Chest: Effort normal and breath sounds normal. No respiratory distress.  Musculoskeletal: Normal range of motion.  Lymphadenopathy:    She has no cervical adenopathy.  Neurological: She is alert and oriented to person, place, and time. She  exhibits normal muscle tone. Coordination normal.  Skin: Skin is warm and dry. No rash noted.  Nursing note and vitals reviewed.    ED Treatments / Results  Labs (all labs ordered are listed, but only abnormal results are displayed) Labs Reviewed - No data to display  EKG  EKG Interpretation None       Radiology No results found.  Procedures Procedures (including critical care time)  Medications Ordered in ED Medications  tetracaine (PONTOCAINE) 0.5 % ophthalmic solution 2 drop (not administered)  fluorescein ophthalmic strip 1 strip (not administered)  tobramycin (TOBREX) 0.3 % ophthalmic solution 1 drop (not administered)  ketorolac (ACULAR) 0.5 % ophthalmic solution 1 drop (not administered)     Initial Impression / Assessment and Plan / ED Course  I have reviewed the triage vital signs and the nursing notes.  Pertinent labs & imaging results that were available during my care of the patient were reviewed by me and considered in my medical decision making (see chart for details).     Pt well appearing.  Tobramycin and ketorolac drops dispensed.  Pt agrees to ophtho f/u if needed.        Visual Acuity  Right Eye Distance: 20/20 Left Eye Distance: 20/30 Bilateral Distance: 20/20    Final Clinical Impressions(s) / ED Diagnoses   Final diagnoses:    Abrasion of left cornea, initial encounter    New Prescriptions New Prescriptions   No medications on file     Bufford Lope 01/27/17 2306    Julianne Rice, MD 02/01/17 [place]

## 2017-01-24 NOTE — ED Triage Notes (Signed)
States she feels like something has been stuck in her eye since this morning.

## 2017-01-24 NOTE — Discharge Instructions (Signed)
Cool compresses on/off to your eye.  Avoid eye strain.  Apply one drop of the tobramycin every 4 hrs and one drop of the ketorolac 4 times a day for 5-7 days.  Avoid rubbing your eyes.  Follow-up with the eye doctor listed or return here for any worsening symptoms

## 2017-11-01 ENCOUNTER — Emergency Department (HOSPITAL_COMMUNITY)
Admission: EM | Admit: 2017-11-01 | Discharge: 2017-11-01 | Disposition: A | Discharge status: Home / Self Care | Payer: Self-pay | Attending: Emergency Medicine | Admitting: Emergency Medicine

## 2017-11-01 ENCOUNTER — Other Ambulatory Visit: Payer: Self-pay

## 2017-11-01 ENCOUNTER — Encounter (HOSPITAL_COMMUNITY): Payer: Self-pay | Admitting: Emergency Medicine

## 2017-11-01 ENCOUNTER — Emergency Department (HOSPITAL_COMMUNITY): Payer: Self-pay

## 2017-11-01 DIAGNOSIS — R739 Hyperglycemia, unspecified: Secondary | ICD-10-CM

## 2017-11-01 DIAGNOSIS — F17210 Nicotine dependence, cigarettes, uncomplicated: Secondary | ICD-10-CM | POA: Insufficient documentation

## 2017-11-01 DIAGNOSIS — Z7984 Long term (current) use of oral hypoglycemic drugs: Secondary | ICD-10-CM | POA: Insufficient documentation

## 2017-11-01 DIAGNOSIS — E1165 Type 2 diabetes mellitus with hyperglycemia: Secondary | ICD-10-CM | POA: Insufficient documentation

## 2017-11-01 DIAGNOSIS — R079 Chest pain, unspecified: Secondary | ICD-10-CM | POA: Insufficient documentation

## 2017-11-01 DIAGNOSIS — Z7982 Long term (current) use of aspirin: Secondary | ICD-10-CM | POA: Insufficient documentation

## 2017-11-01 DIAGNOSIS — I259 Chronic ischemic heart disease, unspecified: Secondary | ICD-10-CM | POA: Insufficient documentation

## 2017-11-01 DIAGNOSIS — Z79899 Other long term (current) drug therapy: Secondary | ICD-10-CM | POA: Insufficient documentation

## 2017-11-01 HISTORY — DX: Non-ST elevation (NSTEMI) myocardial infarction: I21.4

## 2017-11-01 LAB — CBC
HEMATOCRIT: 41.4 % (ref 36.0–46.0)
Hemoglobin: 13.9 g/dL (ref 12.0–15.0)
MCH: 29.6 pg (ref 26.0–34.0)
MCHC: 33.6 g/dL (ref 30.0–36.0)
MCV: 88.1 fL (ref 78.0–100.0)
Platelets: 413 10*3/uL — ABNORMAL HIGH (ref 150–400)
RBC: 4.7 MIL/uL (ref 3.87–5.11)
RDW: 12.2 % (ref 11.5–15.5)
WBC: 13.2 10*3/uL — AB (ref 4.0–10.5)

## 2017-11-01 LAB — BASIC METABOLIC PANEL
Anion gap: 13 (ref 5–15)
BUN: 16 mg/dL (ref 6–20)
CHLORIDE: 102 mmol/L (ref 101–111)
CO2: 22 mmol/L (ref 22–32)
Calcium: 9.7 mg/dL (ref 8.9–10.3)
Creatinine, Ser: 0.58 mg/dL (ref 0.44–1.00)
GFR calc Af Amer: >60 mL/min (ref >60)
GFR calc non Af Amer: >60 mL/min (ref >60)
GLUCOSE: 303 mg/dL — AB (ref 65–99)
POTASSIUM: 3.8 mmol/L (ref 3.5–5.1)
Sodium: 137 mmol/L (ref 135–145)

## 2017-11-01 LAB — I-STAT TROPONIN, ED: Troponin i, poc: 0 ng/mL (ref 0.00–0.08)

## 2017-11-01 LAB — I-STAT BETA HCG BLOOD, ED (MC, WL, AP ONLY)

## 2017-11-01 LAB — TROPONIN I: Troponin I: <0.03 ng/mL (ref <0.03)

## 2017-11-01 MED ORDER — GLIPIZIDE 5 MG PO TABS: 5.0000 mg | ORAL_TABLET | Freq: Every evening | ORAL | 2 refills | Status: DC

## 2017-11-01 MED ORDER — ASPIRIN 81 MG PO CHEW
CHEWABLE_TABLET | ORAL | Status: AC
  Filled 2017-11-01: qty 3

## 2017-11-01 MED ORDER — METFORMIN HCL 500 MG PO TABS: 1000.0000 mg | ORAL_TABLET | Freq: Two times a day (BID) | ORAL | 2 refills | Status: AC

## 2017-11-01 MED ORDER — LISINOPRIL 20 MG PO TABS: 20.0000 mg | ORAL_TABLET | Freq: Every day | ORAL | 2 refills | Status: DC

## 2017-11-01 MED ORDER — ASPIRIN 81 MG PO CHEW
243.0000 mg | CHEWABLE_TABLET | Freq: Once | ORAL | Status: AC
  Administered 2017-11-01: 243 mg via ORAL

## 2017-11-01 MED ORDER — METOPROLOL SUCCINATE ER 25 MG PO TB24: 25.0000 mg | ORAL_TABLET | Freq: Every day | ORAL | 0 refills | Status: DC

## 2017-11-01 MED ORDER — NITROGLYCERIN 0.4 MG SL SUBL: 0.4000 mg | SUBLINGUAL_TABLET | SUBLINGUAL | 1 refills | Status: AC | PRN

## 2017-11-01 NOTE — ED Triage Notes (Signed)
Pt c/o center chest pain with nausea since 1100 today with nausea, has taken 2 nitro with no relief

## 2017-11-01 NOTE — ED Provider Notes (Signed)
[facility] EMERGENCY DEPARTMENT Provider Note   CSN: [id number] Arrival date & time: 11/01/17  1308     History   Chief Complaint Chief Complaint  Patient presents with  . Chest Pain    HPI Alyssa Morales is a 47 y.o. female.  Chest pain since 11 AM this morning while patient was at work.  Pain is described as sharp occurring approximately every minute and lasting 3 to 5 seconds.  She was sweaty but not dyspneic.  She took one nitroglycerin which seemed to help.  Past medical history includes MI in 2016, hypertension, diabetes, hypercholesterolemia.  She has been noncompliant with her medications secondary to monetary issues.  Symptoms have improved dramatically at this point.  She continues to smoke cigarettes.  Severity is moderate.  Nothing makes symptoms better or worse.     Past Medical History:  Diagnosis Date  . CAD (coronary artery disease)    a. NSTEMI (peak trop 0.06) 10/2014 - cath showing first diagonal with 50-70% ostial narrowing, small in distribution, widely patent LAD/LCx/RCA, EF normal.  . Diabetes mellitus with complication ([place])   . Dyslipidemia   . Hypertension   . Hypertriglyceridemia   . NSTEMI (non-ST elevated myocardial infarction) ([place])   . Obesity   . Tobacco abuse     Patient Active Problem List   Diagnosis Date Noted  . Essential hypertension 11/02/2014  . Hyposmolality and/or hyponatremia 11/02/2014  . Hypertriglyceridemia 11/02/2014  . Leukocytosis 11/02/2014  . NSTEMI (non-ST elevated myocardial infarction) ([place]) 11/01/2014  . Obesity 11/01/2014  . Tobacco abuse 11/01/2014  . Diabetes mellitus, type 2 ([place]) 11/01/2014    Past Surgical History:  Procedure Laterality Date  . CESAREAN SECTION    . CHOLECYSTECTOMY    . LEFT HEART CATHETERIZATION WITH CORONARY ANGIOGRAM N/A 11/02/2014   Procedure: LEFT HEART CATHETERIZATION WITH CORONARY ANGIOGRAM;  Surgeon: Belva Crome, MD;  Location: [facility] CATH LAB;  Service: Cardiovascular;   Laterality: N/A;     OB History    Gravida  3   Para  3   Term  3   Preterm      AB      Living  3     SAB      TAB      Ectopic      Multiple      Live Births               Home Medications    Prior to Admission medications   Medication Sig Start Date End Date Taking? Authorizing Provider  aspirin EC 81 MG EC tablet Take 1 tablet (81 mg total) by mouth daily. 11/03/14  Yes Dunn, Dayna N, PA-C  omega-3 acid ethyl esters (LOVAZA) 1 G capsule Take 2 capsules (2 g total) by mouth 2 (two) times daily. 11/02/14  Yes Dunn, Dayna N, PA-C  acetaminophen (TYLENOL) 500 MG tablet Take 1,000 mg by mouth every 6 (six) hours as needed for pain.    [provider]  albuterol (PROVENTIL HFA;VENTOLIN HFA) 108 (90 BASE) MCG/ACT inhaler Inhale 1 puff into the lungs every 6 (six) hours as needed for wheezing or shortness of breath.    [provider]  glipiZIDE (GLUCOTROL) 5 MG tablet Take 1 tablet (5 mg total) by mouth Nightly. 11/01/17   Nat Christen, MD  lisinopril (PRINIVIL,ZESTRIL) 20 MG tablet Take 1 tablet (20 mg total) by mouth daily. 11/01/17   Nat Christen, MD  metFORMIN (GLUCOPHAGE) 500 MG tablet Take 2 tablets (  1,000 mg total) by mouth 2 (two) times daily with a meal. 11/01/17   Nat Christen, MD  metoprolol succinate (TOPROL-XL) 25 MG 24 hr tablet Take 1 tablet (25 mg total) by mouth daily. 11/01/17   Nat Christen, MD  nitroGLYCERIN (NITROSTAT) 0.4 MG SL tablet Place 1 tablet (0.4 mg total) under the tongue every 5 (five) minutes as needed for chest pain. 11/01/17   Nat Christen, MD    Family History Family History  Problem Relation Age of Onset  . Coronary artery disease Father     Social History Social History   Tobacco Use  . Smoking status: Current Every Day Smoker    Packs/day: 1.50    Years: 29.00    Pack years: 43.50    Types: Cigarettes    Start date: 11/09/1984  . Smokeless tobacco: Never Used  Substance Use Topics  . Alcohol use: Yes     Alcohol/week: 0.0 oz    Comment: once a month  . Drug use: No     Allergies   Patient has no known allergies.   Review of Systems Review of Systems  All other systems reviewed and are negative.    Physical Exam Updated Vital Signs BP 124/84   Pulse 72   Temp 98.1 F (36.7 C) (Oral)   Resp 18   Wt 80.7 kg (178 lb)   SpO2 96%   BMI 30.08 kg/m   Physical Exam  Constitutional: She is oriented to person, place, and time. She appears well-developed and well-nourished.  HENT:  Head: Normocephalic and atraumatic.  Eyes: Conjunctivae are normal.  Neck: Neck supple.  Cardiovascular: Normal rate and regular rhythm.  Pulmonary/Chest: Effort normal and breath sounds normal.  Abdominal: Soft. Bowel sounds are normal.  Musculoskeletal: Normal range of motion.  Neurological: She is alert and oriented to person, place, and time.  Skin: Skin is warm and dry.  Psychiatric: She has a normal mood and affect. Her behavior is normal.  Nursing note and vitals reviewed.    ED Treatments / Results  Labs (all labs ordered are listed, but only abnormal results are displayed) Labs Reviewed  BASIC METABOLIC PANEL - Abnormal; Notable for the following components:      Result Value   Glucose, Bld 303 (*)    All other components within normal limits  CBC - Abnormal; Notable for the following components:   WBC 13.2 (*)    Platelets 413 (*)    All other components within normal limits  TROPONIN I  I-STAT TROPONIN, ED  I-STAT BETA HCG BLOOD, ED ([facility], [facility], [facility] ONLY)    EKG EKG Interpretation  Date/Time:  Thursday November 01 2017 13:18:42 EDT Ventricular Rate:  91 PR Interval:    QRS Duration: 105 QT Interval:  393 QTC Calculation: 484 R Axis:   33 Text Interpretation:  Sinus rhythm Low voltage, extremity and precordial leads Confirmed by Nat Christen [phone]) on 11/01/2017 1:50:09 PM   Radiology Dg Chest 2 View  Result Date: 11/01/2017 CLINICAL DATA:  Chest pain EXAM: CHEST - 2 VIEW  COMPARISON:  Chest radiograph 01/30/2016 FINDINGS: The heart size and mediastinal contours are within normal limits. Both lungs are clear. The visualized skeletal structures are unremarkable. IMPRESSION: Clear lungs. Electronically Signed   By: Ulyses Jarred M.D.   On: 11/01/2017 13:51    Procedures Procedures (including critical care time)  Medications Ordered in ED Medications  aspirin chewable tablet 243 mg (243 mg Oral Given 11/01/17 1326)  Initial Impression / Assessment and Plan / ED Course  I have reviewed the triage vital signs and the nursing notes.  Pertinent labs & imaging results that were available during my care of the patient were reviewed by me and considered in my medical decision making (see chart for details).     Patient presents with atypical chest pain.  However she does have multiple cardiac risk factors.  EKG shows normal sinus rhythm and troponins were negative x2.  Chest x-ray normal.  Patient wants to be discharged from the hospital.  I have tried to help her by refilling her blood pressure and diabetes medication.  She will get primary care follow-up.  Final Clinical Impressions(s) / ED Diagnoses   Final diagnoses:  Chest pain, unspecified type  Hyperglycemia    ED Discharge Orders        Ordered    lisinopril (PRINIVIL,ZESTRIL) 20 MG tablet  Daily     11/01/17 1820    metoprolol succinate (TOPROL-XL) 25 MG 24 hr tablet  Daily     11/01/17 1820    glipiZIDE (GLUCOTROL) 5 MG tablet  (Dosepack) Nightly - one time     11/01/17 1820    metFORMIN (GLUCOPHAGE) 500 MG tablet  2 times daily with meals     11/01/17 1820    nitroGLYCERIN (NITROSTAT) 0.4 MG SL tablet  Every 5 min PRN     11/01/17 1844       Nat Christen, MD 11/01/17 [phone]

## 2017-11-01 NOTE — Discharge Instructions (Addendum)
You need primary care follow-up.  Prescriptions for your diabetes and blood pressure medicine.  Stop smoking.  Return if worse.

## 2017-12-20 ENCOUNTER — Other Ambulatory Visit: Payer: Self-pay

## 2017-12-20 ENCOUNTER — Encounter (HOSPITAL_COMMUNITY): Payer: Self-pay | Admitting: Emergency Medicine

## 2017-12-20 ENCOUNTER — Emergency Department (HOSPITAL_COMMUNITY)
Admission: EM | Admit: 2017-12-20 | Discharge: 2017-12-20 | Disposition: A | Discharge status: Home / Self Care | Payer: Self-pay | Attending: Emergency Medicine | Admitting: Emergency Medicine

## 2017-12-20 DIAGNOSIS — Y9289 Other specified places as the place of occurrence of the external cause: Secondary | ICD-10-CM | POA: Insufficient documentation

## 2017-12-20 DIAGNOSIS — Z7982 Long term (current) use of aspirin: Secondary | ICD-10-CM | POA: Insufficient documentation

## 2017-12-20 DIAGNOSIS — Z7984 Long term (current) use of oral hypoglycemic drugs: Secondary | ICD-10-CM | POA: Insufficient documentation

## 2017-12-20 DIAGNOSIS — I2510 Atherosclerotic heart disease of native coronary artery without angina pectoris: Secondary | ICD-10-CM | POA: Insufficient documentation

## 2017-12-20 DIAGNOSIS — S81811A Laceration without foreign body, right lower leg, initial encounter: Secondary | ICD-10-CM | POA: Insufficient documentation

## 2017-12-20 DIAGNOSIS — Z79899 Other long term (current) drug therapy: Secondary | ICD-10-CM | POA: Insufficient documentation

## 2017-12-20 DIAGNOSIS — Y990 Civilian activity done for income or pay: Secondary | ICD-10-CM | POA: Insufficient documentation

## 2017-12-20 DIAGNOSIS — W268XXA Contact with other sharp object(s), not elsewhere classified, initial encounter: Secondary | ICD-10-CM | POA: Insufficient documentation

## 2017-12-20 DIAGNOSIS — Y9389 Activity, other specified: Secondary | ICD-10-CM | POA: Insufficient documentation

## 2017-12-20 DIAGNOSIS — Z23 Encounter for immunization: Secondary | ICD-10-CM | POA: Insufficient documentation

## 2017-12-20 DIAGNOSIS — I10 Essential (primary) hypertension: Secondary | ICD-10-CM | POA: Insufficient documentation

## 2017-12-20 DIAGNOSIS — I252 Old myocardial infarction: Secondary | ICD-10-CM | POA: Insufficient documentation

## 2017-12-20 DIAGNOSIS — F17210 Nicotine dependence, cigarettes, uncomplicated: Secondary | ICD-10-CM | POA: Insufficient documentation

## 2017-12-20 DIAGNOSIS — E119 Type 2 diabetes mellitus without complications: Secondary | ICD-10-CM | POA: Insufficient documentation

## 2017-12-20 MED ORDER — BACITRACIN ZINC 500 UNIT/GM EX OINT
TOPICAL_OINTMENT | Freq: Once | CUTANEOUS | Status: AC
  Administered 2017-12-20: 22:00:00 via TOPICAL
  Filled 2017-12-20: qty 0.9

## 2017-12-20 MED ORDER — TETANUS-DIPHTH-ACELL PERTUSSIS 5-2.5-18.5 LF-MCG/0.5 IM SUSP
0.5000 mL | Freq: Once | INTRAMUSCULAR | Status: AC
  Administered 2017-12-20: 0.5 mL via INTRAMUSCULAR
  Filled 2017-12-20: qty 0.5

## 2017-12-20 MED ORDER — LIDOCAINE-EPINEPHRINE (PF) 2 %-1:200000 IJ SOLN
20.0000 mL | Freq: Once | INTRAMUSCULAR | Status: AC
  Administered 2017-12-20: 20 mL
  Filled 2017-12-20: qty 20

## 2017-12-20 NOTE — Discharge Instructions (Signed)
Sutures will need to be removed in about 10 days.  Keep the wound clean and dry and covered with bacitracin ointment.  You may shower but do not submerge the leg underwater, no swimming.  Monitor the wound for any signs of infection such as redness, warmth, swelling or drainage, if you see any of these things you should return to the emergency department for sooner reevaluation.

## 2017-12-20 NOTE — ED Triage Notes (Addendum)
Pt states she was "cutting a box open at work with a box cutter and sliced her right leg." Pt has a 3-4in laceration to the right lateral quad. Bleeding controlled.

## 2017-12-20 NOTE — ED Provider Notes (Signed)
[facility] EMERGENCY DEPARTMENT Provider Note   CSN: [id number] Arrival date & time: 12/20/17  2037     History   Chief Complaint Chief Complaint  Patient presents with  . Laceration    HPI Alyssa Morales is a 47 y.o. female.  Alyssa Morales is a 47 y.o. Female with a history of CAD, diabetes, anemia, hypertension and tobacco use, who presents to the emergency department for evaluation of laceration to the right outer thigh.  Patient reports she was at work at [employer], when she cut her leg with a box cutter.  She reports immediate pain and burning which is been constant since then.  She reports she immediately applied pressure and controlled bleeding, and presented to the ED for further evaluation.  She has been able to move her leg without difficulty, denies any numbness or weakness.  Patient does not think her tetanus is up-to-date.  Not on any blood thinners.     Past Medical History:  Diagnosis Date  . CAD (coronary artery disease)    a. NSTEMI (peak trop 0.06) 10/2014 - cath showing first diagonal with 50-70% ostial narrowing, small in distribution, widely patent LAD/LCx/RCA, EF normal.  . Diabetes mellitus with complication ([place])   . Dyslipidemia   . Hypertension   . Hypertriglyceridemia   . NSTEMI (non-ST elevated myocardial infarction) ([place])   . Obesity   . Tobacco abuse     Patient Active Problem List   Diagnosis Date Noted  . Essential hypertension 11/02/2014  . Hyposmolality and/or hyponatremia 11/02/2014  . Hypertriglyceridemia 11/02/2014  . Leukocytosis 11/02/2014  . NSTEMI (non-ST elevated myocardial infarction) ([place]) 11/01/2014  . Obesity 11/01/2014  . Tobacco abuse 11/01/2014  . Diabetes mellitus, type 2 ([place]) 11/01/2014    Past Surgical History:  Procedure Laterality Date  . CESAREAN SECTION    . CHOLECYSTECTOMY    . LEFT HEART CATHETERIZATION WITH CORONARY ANGIOGRAM N/A 11/02/2014   Procedure: LEFT HEART CATHETERIZATION WITH CORONARY  ANGIOGRAM;  Surgeon: Belva Crome, MD;  Location: [facility] CATH LAB;  Service: Cardiovascular;  Laterality: N/A;     OB History    Gravida  3   Para  3   Term  3   Preterm      AB      Living  3     SAB      TAB      Ectopic      Multiple      Live Births               Home Medications    Prior to Admission medications   Medication Sig Start Date End Date Taking? Authorizing Provider  acetaminophen (TYLENOL) 500 MG tablet Take 1,000 mg by mouth every 6 (six) hours as needed for pain.    [provider]  albuterol (PROVENTIL HFA;VENTOLIN HFA) 108 (90 BASE) MCG/ACT inhaler Inhale 1 puff into the lungs every 6 (six) hours as needed for wheezing or shortness of breath.    [provider]  aspirin EC 81 MG EC tablet Take 1 tablet (81 mg total) by mouth daily. 11/03/14   Dunn, Nedra Hai, PA-C  glipiZIDE (GLUCOTROL) 5 MG tablet Take 1 tablet (5 mg total) by mouth Nightly. 11/01/17   Nat Christen, MD  lisinopril (PRINIVIL,ZESTRIL) 20 MG tablet Take 1 tablet (20 mg total) by mouth daily. 11/01/17   Nat Christen, MD  metFORMIN (GLUCOPHAGE) 500 MG tablet Take 2 tablets (1,000 mg total) by mouth 2 (  two) times daily with a meal. 11/01/17   Nat Christen, MD  metoprolol succinate (TOPROL-XL) 25 MG 24 hr tablet Take 1 tablet (25 mg total) by mouth daily. 11/01/17   Nat Christen, MD  nitroGLYCERIN (NITROSTAT) 0.4 MG SL tablet Place 1 tablet (0.4 mg total) under the tongue every 5 (five) minutes as needed for chest pain. 11/01/17   Nat Christen, MD  omega-3 acid ethyl esters (LOVAZA) 1 G capsule Take 2 capsules (2 g total) by mouth 2 (two) times daily. 11/02/14   Charlie Pitter, PA-C    Family History Family History  Problem Relation Age of Onset  . Coronary artery disease Father     Social History Social History   Tobacco Use  . Smoking status: Current Every Day Smoker    Packs/day: 1.50    Years: 29.00    Pack years: 43.50    Types: Cigarettes    Start date: 11/09/1984  .  Smokeless tobacco: Never Used  Substance Use Topics  . Alcohol use: Yes    Alcohol/week: 0.0 oz    Comment: once a month  . Drug use: No     Allergies   Patient has no known allergies.   Review of Systems Review of Systems  Constitutional: Negative for chills and fever.  Musculoskeletal: Negative for arthralgias, joint swelling and myalgias.  Skin: Positive for wound.  Neurological: Negative for weakness and numbness.     Physical Exam Updated Vital Signs BP (!) 149/85 (BP Location: Left Arm)   Pulse 88   Temp 98.5 F (36.9 C) (Oral)   Resp 18   Ht 5' 0.25" (1.53 m)   Wt 81.2 kg (179 lb)   SpO2 99%   BMI 34.67 kg/m   Physical Exam  Constitutional: She appears well-developed and well-nourished. No distress.  HENT:  Head: Normocephalic and atraumatic.  Eyes: Right eye exhibits no discharge. Left eye exhibits no discharge.  Pulmonary/Chest: Effort normal. No respiratory distress.  Musculoskeletal:  7 cm laceration present over the right outer thigh, subcutaneous tissue visible, bleeding is controlled, wound exploration reveals no evidence of vascular or muscular involvement.  2+ DP and TP pulses.  Sensation intact throughout the right lower extremity, normal range of motion at the hip, knee and ankle with normal strength.  Neurological: She is alert. Coordination normal.  Skin: Skin is warm and dry. She is not diaphoretic.  Psychiatric: She has a normal mood and affect. Her behavior is normal.  Nursing note and vitals reviewed.    ED Treatments / Results  Labs (all labs ordered are listed, but only abnormal results are displayed) Labs Reviewed - No data to display  EKG None  Radiology No results found.  Procedures .Marland KitchenLaceration Repair Date/Time: 12/20/2017 10:05 PM Performed by: Jacqlyn Larsen, PA-C Authorized by: Jacqlyn Larsen, PA-C   Consent:    Consent obtained:  Verbal   Consent given by:  Patient   Risks discussed:  Infection, pain, poor cosmetic  result and poor wound healing   Alternatives discussed:  No treatment Anesthesia (see MAR for exact dosages):    Anesthesia method:  Local infiltration   Local anesthetic:  Lidocaine 2% WITH epi Laceration details:    Location:  Leg   Leg location:  R upper leg   Length (cm):  7 Repair type:    Repair type:  Simple Pre-procedure details:    Preparation:  Patient was prepped and draped in usual sterile fashion Exploration:    Hemostasis achieved with:  Direct pressure and epinephrine   Wound exploration: wound explored through full range of motion and entire depth of wound probed and visualized     Wound extent: areolar tissue violated     Wound extent: no muscle damage noted, no nerve damage noted, no tendon damage noted and no vascular damage noted   Treatment:    Area cleansed with:  Saline   Amount of cleaning:  Standard   Irrigation solution:  Sterile saline   Irrigation method:  Pressure wash Skin repair:    Repair method:  Sutures   Suture size:  4-0   Suture material:  Prolene   Suture technique:  Simple interrupted   Number of sutures:  10 Approximation:    Approximation:  Close Post-procedure details:    Dressing:  Antibiotic ointment and adhesive bandage   Patient tolerance of procedure:  Tolerated well, no immediate complications   (including critical care time)  Medications Ordered in ED Medications  lidocaine-EPINEPHrine (XYLOCAINE W/EPI) 2 %-1:200000 (PF) injection 20 mL (20 mLs Infiltration Given 12/20/17 2207)  Tdap (BOOSTRIX) injection 0.5 mL (0.5 mLs Intramuscular Given 12/20/17 2123)  bacitracin ointment ( Topical Given 12/20/17 2216)     Initial Impression / Assessment and Plan / ED Course  I have reviewed the triage vital signs and the nursing notes.  Pertinent labs & imaging results that were available during my care of the patient were reviewed by me and considered in my medical decision making (see chart for details).  Patient presents for  evaluation of laceration to the right outer thigh, bleeding is controlled, patient not on any blood thinners.  Wound irrigated.  Wound explored and base of wound visualized in a bloodless field without evidence of foreign body, wound appears to have only interrupted the subcutaneous tissue, no evidence of vascular or muscle damage.  Laceration occurred < 8 hours prior to repair which was well tolerated. Tdap updated.  Wound dressed with antibiotic ointment and bandage.  Pt discharged without antibiotics as laceration was overall very clean.  Discussed suture home care with patient and answered questions. Pt to follow-up for wound check and suture removal in 10-14 days; they are to return to the ED sooner for signs of infection. Pt is hemodynamically stable with no complaints prior to dc.   Final Clinical Impressions(s) / ED Diagnoses   Final diagnoses:  Laceration of right lower extremity, initial encounter    ED Discharge Orders    None       Jacqlyn Larsen, [place] 12/21/17 0154    Daleen Bo, MD 12/21/17 [phone]

## 2017-12-20 NOTE — ED Notes (Signed)
telfa dressing placed and wrapped in bandage

## 2020-08-27 ENCOUNTER — Other Ambulatory Visit: Payer: Self-pay

## 2020-08-27 ENCOUNTER — Emergency Department (HOSPITAL_COMMUNITY)
Admission: EM | Admit: 2020-08-27 | Discharge: 2020-08-28 | Disposition: A | Discharge status: Home / Self Care | Payer: Self-pay | Attending: Emergency Medicine | Admitting: Emergency Medicine

## 2020-08-27 ENCOUNTER — Encounter (HOSPITAL_COMMUNITY): Payer: Self-pay

## 2020-08-27 DIAGNOSIS — I10 Essential (primary) hypertension: Secondary | ICD-10-CM | POA: Insufficient documentation

## 2020-08-27 DIAGNOSIS — F17210 Nicotine dependence, cigarettes, uncomplicated: Secondary | ICD-10-CM | POA: Insufficient documentation

## 2020-08-27 DIAGNOSIS — I83811 Varicose veins of right lower extremities with pain: Secondary | ICD-10-CM | POA: Insufficient documentation

## 2020-08-27 DIAGNOSIS — I2510 Atherosclerotic heart disease of native coronary artery without angina pectoris: Secondary | ICD-10-CM | POA: Insufficient documentation

## 2020-08-27 DIAGNOSIS — Z79899 Other long term (current) drug therapy: Secondary | ICD-10-CM | POA: Insufficient documentation

## 2020-08-27 DIAGNOSIS — Z7984 Long term (current) use of oral hypoglycemic drugs: Secondary | ICD-10-CM | POA: Insufficient documentation

## 2020-08-27 DIAGNOSIS — E119 Type 2 diabetes mellitus without complications: Secondary | ICD-10-CM | POA: Insufficient documentation

## 2020-08-27 DIAGNOSIS — Z7982 Long term (current) use of aspirin: Secondary | ICD-10-CM | POA: Insufficient documentation

## 2020-08-27 NOTE — ED Triage Notes (Signed)
Pt arrives via POV from home c/o right leg pain which began apprx 2.5 hrs ago. Pt presents with swelling around a vein on posterior, medial lower right leg. Pt ambulatory. Pt reports pain upon touch and warmth to touch.

## 2020-08-28 NOTE — ED Provider Notes (Signed)
[facility] EMERGENCY DEPARTMENT Provider Note   CSN: [id number] Arrival date & time: 08/27/20  2107     History Chief Complaint  Patient presents with  . Leg Pain    Alyssa Morales is a 50 y.o. female.  The history is provided by the patient.  Leg Pain Location:  Leg Leg location:  R lower leg Pain details:    Quality:  Aching and burning   Radiates to:  Does not radiate   Severity:  Moderate   Onset quality:  Sudden   Timing:  Constant   Progression:  Unchanged Chronicity:  New Relieved by:  Nothing Exacerbated by: Palpation. Associated symptoms: no fever and no swelling    Patient reports she was at work when she had sudden onset of pain in her right calf.  She reports the varicose vein on her right calf appeared to enlarge and became bluish.  She does not recall significant trauma, but may have hit it on boxes at work.  No fevers or vomiting.  No chest pain shortness of breath.  No previous history of VTE. The pain does not radiate into her thigh.  She denies any calf swelling    Past Medical History:  Diagnosis Date  . CAD (coronary artery disease)    a. NSTEMI (peak trop 0.06) 10/2014 - cath showing first diagonal with 50-70% ostial narrowing, small in distribution, widely patent LAD/LCx/RCA, EF normal.  . Diabetes mellitus with complication ([place])   . Dyslipidemia   . Hypertension   . Hypertriglyceridemia   . NSTEMI (non-ST elevated myocardial infarction) ([place])   . Obesity   . Tobacco abuse     Patient Active Problem List   Diagnosis Date Noted  . Essential hypertension 11/02/2014  . Hyposmolality and/or hyponatremia 11/02/2014  . Hypertriglyceridemia 11/02/2014  . Leukocytosis 11/02/2014  . NSTEMI (non-ST elevated myocardial infarction) ([place]) 11/01/2014  . Obesity 11/01/2014  . Tobacco abuse 11/01/2014  . Diabetes mellitus, type 2 ([place]) 11/01/2014    Past Surgical History:  Procedure Laterality Date  . CESAREAN SECTION    . CHOLECYSTECTOMY    .  LEFT HEART CATHETERIZATION WITH CORONARY ANGIOGRAM N/A 11/02/2014   Procedure: LEFT HEART CATHETERIZATION WITH CORONARY ANGIOGRAM;  Surgeon: Belva Crome, MD;  Location: [facility] CATH LAB;  Service: Cardiovascular;  Laterality: N/A;     OB History    Gravida  3   Para  3   Term  3   Preterm      AB      Living  3     SAB      IAB      Ectopic      Multiple      Live Births              Family History  Problem Relation Age of Onset  . Coronary artery disease Father     Social History   Tobacco Use  . Smoking status: Current Every Day Smoker    Packs/day: 1.50    Years: 29.00    Pack years: 43.50    Types: Cigarettes    Start date: 11/09/1984  . Smokeless tobacco: Never Used  Vaping Use  . Vaping Use: Never used  Substance Use Topics  . Alcohol use: Yes    Alcohol/week: 0.0 standard drinks    Comment: once a month  . Drug use: No    Home Medications Prior to Admission medications   Medication Sig Start Date End Date Taking? Authorizing  Provider  acetaminophen (TYLENOL) 500 MG tablet Take 1,000 mg by mouth every 6 (six) hours as needed for pain.    [provider]  albuterol (PROVENTIL HFA;VENTOLIN HFA) 108 (90 BASE) MCG/ACT inhaler Inhale 1 puff into the lungs every 6 (six) hours as needed for wheezing or shortness of breath.    [provider]  aspirin EC 81 MG EC tablet Take 1 tablet (81 mg total) by mouth daily. 11/03/14   Dunn, Nedra Hai, PA-C  glipiZIDE (GLUCOTROL) 5 MG tablet Take 1 tablet (5 mg total) by mouth Nightly. 11/01/17   Nat Christen, MD  lisinopril (PRINIVIL,ZESTRIL) 20 MG tablet Take 1 tablet (20 mg total) by mouth daily. 11/01/17   Nat Christen, MD  metFORMIN (GLUCOPHAGE) 500 MG tablet Take 2 tablets (1,000 mg total) by mouth 2 (two) times daily with a meal. 11/01/17   Nat Christen, MD  metoprolol succinate (TOPROL-XL) 25 MG 24 hr tablet Take 1 tablet (25 mg total) by mouth daily. 11/01/17   Nat Christen, MD  nitroGLYCERIN (NITROSTAT)  0.4 MG SL tablet Place 1 tablet (0.4 mg total) under the tongue every 5 (five) minutes as needed for chest pain. 11/01/17   Nat Christen, MD  omega-3 acid ethyl esters (LOVAZA) 1 G capsule Take 2 capsules (2 g total) by mouth 2 (two) times daily. 11/02/14   Charlie Pitter, PA-C    Allergies    Patient has no known allergies.  Review of Systems   Review of Systems  Constitutional: Negative for fever.  Respiratory: Negative for shortness of breath.   Cardiovascular: Negative for chest pain.  Musculoskeletal: Negative for joint swelling.  Skin: Positive for color change.  All other systems reviewed and are negative.   Physical Exam Updated Vital Signs BP (!) 144/91   Pulse 86   Temp 98.6 F (37 C) (Oral)   Resp 16   Ht 1.626 m (5\' 4" )   Wt 88 kg   SpO2 99%   BMI 33.30 kg/m   Physical Exam CONSTITUTIONAL: Well developed/well nourished HEAD: Normocephalic/atraumatic EYES: EOMI ENMT: Mucous membranes moist NECK: supple no meningeal signs SPINE/BACK:entire spine nontender CV: S1/S2 noted, no murmurs/rubs/gallops noted LUNGS: Lungs are clear to auscultation bilaterally, no apparent distress ABDOMEN: soft, nontender NEURO: Pt is awake/alert/appropriate, moves all extremitiesx4.  No facial droop.   EXTREMITIES: pulses normal/equal, full ROM, no calf tenderness.  Distal pulses equal and intact.  No erythema noted.  No crepitus.  See photo below SKIN: warm, color normal PSYCH: no abnormalities of mood noted, alert and oriented to situation      ED Results / Procedures / Treatments   Labs (all labs ordered are listed, but only abnormal results are displayed) Labs Reviewed - No data to display  EKG None  Radiology No results found.  Procedures Procedures   Medications Ordered in ED Medications - No data to display  ED Course  I have reviewed the triage vital signs and the nursing notes.      MDM Rules/Calculators/A&P                          Patient appears to  have an enlarging varicose vein.  It is mildly tender to palpation.  No secondary signs of DVT. No further work-up at this time.  Patient agreeable w/plan Final Clinical Impression(s) / ED Diagnoses Final diagnoses:  Varicose veins of right lower extremity with pain    Rx / DC Orders ED Discharge Orders  None       Ripley Fraise, MD 08/28/20 [phone]

## 2020-09-15 ENCOUNTER — Other Ambulatory Visit (HOSPITAL_COMMUNITY): Payer: Self-pay | Admitting: Family Medicine

## 2020-09-15 DIAGNOSIS — T148XXA Other injury of unspecified body region, initial encounter: Secondary | ICD-10-CM

## 2020-09-18 ENCOUNTER — Emergency Department (HOSPITAL_COMMUNITY)
Admission: EM | Admit: 2020-09-18 | Discharge: 2020-09-18 | Disposition: A | Discharge status: Home / Self Care | Payer: Self-pay | Attending: Emergency Medicine | Admitting: Emergency Medicine

## 2020-09-18 ENCOUNTER — Emergency Department (HOSPITAL_COMMUNITY): Payer: Self-pay

## 2020-09-18 ENCOUNTER — Other Ambulatory Visit: Payer: Self-pay

## 2020-09-18 DIAGNOSIS — X501XXA Overexertion from prolonged static or awkward postures, initial encounter: Secondary | ICD-10-CM | POA: Insufficient documentation

## 2020-09-18 DIAGNOSIS — Z7984 Long term (current) use of oral hypoglycemic drugs: Secondary | ICD-10-CM | POA: Insufficient documentation

## 2020-09-18 DIAGNOSIS — Z955 Presence of coronary angioplasty implant and graft: Secondary | ICD-10-CM | POA: Insufficient documentation

## 2020-09-18 DIAGNOSIS — R52 Pain, unspecified: Secondary | ICD-10-CM

## 2020-09-18 DIAGNOSIS — E119 Type 2 diabetes mellitus without complications: Secondary | ICD-10-CM | POA: Insufficient documentation

## 2020-09-18 DIAGNOSIS — Z79899 Other long term (current) drug therapy: Secondary | ICD-10-CM | POA: Insufficient documentation

## 2020-09-18 DIAGNOSIS — Z7982 Long term (current) use of aspirin: Secondary | ICD-10-CM | POA: Insufficient documentation

## 2020-09-18 DIAGNOSIS — S62617A Displaced fracture of proximal phalanx of left little finger, initial encounter for closed fracture: Secondary | ICD-10-CM | POA: Insufficient documentation

## 2020-09-18 DIAGNOSIS — I10 Essential (primary) hypertension: Secondary | ICD-10-CM | POA: Insufficient documentation

## 2020-09-18 DIAGNOSIS — I2510 Atherosclerotic heart disease of native coronary artery without angina pectoris: Secondary | ICD-10-CM | POA: Insufficient documentation

## 2020-09-18 DIAGNOSIS — F17210 Nicotine dependence, cigarettes, uncomplicated: Secondary | ICD-10-CM | POA: Insufficient documentation

## 2020-09-18 NOTE — ED Triage Notes (Signed)
Pt to the ED with Left 5th digit pain. Splint is in place and pt states a heavy box bent finger back at an angle and is broken.

## 2020-09-18 NOTE — ED Notes (Addendum)
Entered room and introduced self to patient. Pt appears to be resting in bed, respirations are even and unlabored with equal chest rise and fall. Bed is locked in the lowest position, side rails x2, call bell within reach. Pt educated on call light use and hourly rounding, verbalized understanding and in agreement at this time. All questions and concerns voiced addressed. Refreshments offered and provided per patient request.  

## 2020-09-18 NOTE — ED Notes (Signed)
Patient transported to X-ray 

## 2020-09-19 NOTE — ED Provider Notes (Signed)
[facility] Provider Note   CSN: [id number] Arrival date & time: 09/18/20  1335     History Chief Complaint  Patient presents with  . Finger Injury    Alyssa Morales is a 50 y.o. female.  Pt reports finger was bent back by a heavy box.  Pt thinks she has a broken finger.  Pt reports decreased range of motion         Past Medical History:  Diagnosis Date  . CAD (coronary artery disease)    a. NSTEMI (peak trop 0.06) 10/2014 - cath showing first diagonal with 50-70% ostial narrowing, small in distribution, widely patent LAD/LCx/RCA, EF normal.  . Diabetes mellitus with complication ([place])   . Dyslipidemia   . Hypertension   . Hypertriglyceridemia   . NSTEMI (non-ST elevated myocardial infarction) ([place])   . Obesity   . Tobacco abuse     Patient Active Problem List   Diagnosis Date Noted  . Essential hypertension 11/02/2014  . Hyposmolality and/or hyponatremia 11/02/2014  . Hypertriglyceridemia 11/02/2014  . Leukocytosis 11/02/2014  . NSTEMI (non-ST elevated myocardial infarction) ([place]) 11/01/2014  . Obesity 11/01/2014  . Tobacco abuse 11/01/2014  . Diabetes mellitus, type 2 ([place]) 11/01/2014    Past Surgical History:  Procedure Laterality Date  . CESAREAN SECTION    . CHOLECYSTECTOMY    . LEFT HEART CATHETERIZATION WITH CORONARY ANGIOGRAM N/A 11/02/2014   Procedure: LEFT HEART CATHETERIZATION WITH CORONARY ANGIOGRAM;  Surgeon: Belva Crome, MD;  Location: [facility] CATH LAB;  Service: Cardiovascular;  Laterality: N/A;     OB History    Gravida  3   Para  3   Term  3   Preterm      AB      Living  3     SAB      IAB      Ectopic      Multiple      Live Births              Family History  Problem Relation Age of Onset  . Coronary artery disease Father     Social History   Tobacco Use  . Smoking status: Current Every Day Smoker    Packs/day: 1.50    Years: 29.00    Pack years: 43.50    Types: Cigarettes    Start  date: 11/09/1984  . Smokeless tobacco: Never Used  Vaping Use  . Vaping Use: Never used  Substance Use Topics  . Alcohol use: Yes    Alcohol/week: 0.0 standard drinks    Comment: once a month  . Drug use: No    Home Medications Prior to Admission medications   Medication Sig Start Date End Date Taking? Authorizing Provider  acetaminophen (TYLENOL) 500 MG tablet Take 1,000 mg by mouth every 6 (six) hours as needed for pain.    [provider]  albuterol (PROVENTIL HFA;VENTOLIN HFA) 108 (90 BASE) MCG/ACT inhaler Inhale 1 puff into the lungs every 6 (six) hours as needed for wheezing or shortness of breath.    [provider]  aspirin EC 81 MG EC tablet Take 1 tablet (81 mg total) by mouth daily. 11/03/14   Dunn, Nedra Hai, PA-C  glipiZIDE (GLUCOTROL) 5 MG tablet Take 1 tablet (5 mg total) by mouth Nightly. 11/01/17   Nat Christen, MD  lisinopril (PRINIVIL,ZESTRIL) 20 MG tablet Take 1 tablet (20 mg total) by mouth daily. 11/01/17   Nat Christen, MD  metFORMIN (GLUCOPHAGE) 500  MG tablet Take 2 tablets (1,000 mg total) by mouth 2 (two) times daily with a meal. 11/01/17   Nat Christen, MD  metoprolol succinate (TOPROL-XL) 25 MG 24 hr tablet Take 1 tablet (25 mg total) by mouth daily. 11/01/17   Nat Christen, MD  nitroGLYCERIN (NITROSTAT) 0.4 MG SL tablet Place 1 tablet (0.4 mg total) under the tongue every 5 (five) minutes as needed for chest pain. 11/01/17   Nat Christen, MD  omega-3 acid ethyl esters (LOVAZA) 1 G capsule Take 2 capsules (2 g total) by mouth 2 (two) times daily. 11/02/14   Charlie Pitter, PA-C    Allergies    Patient has no known allergies.  Review of Systems   Review of Systems  Musculoskeletal: Positive for joint swelling and myalgias.  All other systems reviewed and are negative.   Physical Exam Updated Vital Signs BP 124/81 (BP Location: Right Arm)   Pulse 80   Temp 98.5 F (36.9 C) (Oral)   Resp 15   Ht 5\' 4"  (1.626 m)   Wt 85.7 kg   LMP 10/01/2016   SpO2  99%   BMI 32.44 kg/m   Physical Exam Vitals and nursing note reviewed.  Constitutional:      Appearance: She is well-developed.  HENT:     Head: Normocephalic.  Pulmonary:     Effort: Pulmonary effort is normal.  Abdominal:     General: There is no distension.  Musculoskeletal:        General: Swelling, tenderness and deformity present.     Cervical back: Normal range of motion.     Comments: Swollen tender left fifth finger,   Skin:    General: Skin is warm.  Neurological:     General: No focal deficit present.     Mental Status: She is alert and oriented to person, place, and time.  Psychiatric:        Mood and Affect: Mood normal.     ED Results / Procedures / Treatments   Labs (all labs ordered are listed, but only abnormal results are displayed) Labs Reviewed - No data to display  EKG None  Radiology DG Hand Complete Left  Result Date: 09/18/2020 CLINICAL DATA:  Pain after heavy object fell on hand EXAM: LEFT HAND - COMPLETE 3+ VIEW COMPARISON:  None. FINDINGS: Frontal, oblique, and lateral views were obtained. There is a fracture of the proximal metaphysis of the fifth proximal phalanx. There is slight dorsal angulation distally in this area. No other fracture. No dislocation. Joint spaces appear normal. No erosive change. IMPRESSION: Fracture proximal metaphysis of the fifth proximal phalanx with mild angulation distally. No other fracture. No dislocation. No appreciable arthropathic change. Electronically Signed   By: Lowella Grip III M.D.   On: 09/18/2020 14:45    Procedures Procedures   Medications Ordered in ED Medications - No data to display  ED Course  I have reviewed the triage vital signs and the nursing notes.  Pertinent labs & imaging results that were available during my care of the patient were reviewed by me and considered in my medical decision making (see chart for details).    MDM Rules/Calculators/A&P                          MDM:   Pt placed in a splint,  Pt advised to follow up with orthopaedist for evalatuion Final Clinical Impression(s) / ED Diagnoses Final diagnoses:  Closed displaced fracture of  proximal phalanx of left little finger, initial encounter    Rx / DC Orders ED Discharge Orders    None    An After Visit Summary was printed and given to the patient.    Fransico Meadow, PA-C 09/19/20 5909    Lorelle Gibbs, DO 09/19/20 1114

## 2020-09-22 ENCOUNTER — Ambulatory Visit: Payer: Self-pay | Admitting: Orthopedic Surgery

## 2020-09-22 ENCOUNTER — Encounter: Payer: Self-pay | Admitting: Physician Assistant

## 2020-09-22 ENCOUNTER — Encounter: Payer: Self-pay | Admitting: Orthopedic Surgery

## 2020-09-22 ENCOUNTER — Other Ambulatory Visit: Payer: Self-pay

## 2020-09-22 VITALS — BP 138/79 | HR 81 | Ht 64.0 in | Wt 189.0 lb

## 2020-09-22 DIAGNOSIS — S62647A Nondisplaced fracture of proximal phalanx of left little finger, initial encounter for closed fracture: Secondary | ICD-10-CM

## 2020-09-22 NOTE — Progress Notes (Signed)
Chief Complaint  Patient presents with  . Hand Injury    Lt hand  injury. Pt had a heavy box land on left hand bending pinky back. DOI 09/13/20    50 yo female injured the left small finger hyperextending it on March 7 developed pain swelling ecchymosis and went to the ER on March 12 for x-ray shows a proximal phalanx fracture left hand small finger but also has a lot of pain at the PIP joint  System review is negative for any chest pain shortness of breath numbness or tingling  pmh Past Medical History:  Diagnosis Date  . CAD (coronary artery disease)    a. NSTEMI (peak trop 0.06) 10/2014 - cath showing first diagonal with 50-70% ostial narrowing, small in distribution, widely patent LAD/LCx/RCA, EF normal.  . Diabetes mellitus with complication ([place])   . Dyslipidemia   . Hypertension   . Hypertriglyceridemia   . NSTEMI (non-ST elevated myocardial infarction) ([place])   . Obesity   . Tobacco abuse     BP 138/79   Pulse 81   Ht 5\' 4"  (1.626 m)   Wt 189 lb (85.7 kg)   LMP 10/01/2016   BMI 32.44 kg/m   She is awake alert and oriented x3  Mood and affect are normal  General appearance well-groomed no developmental abnormalities  Left small finger swollen some ecchymosis tender at the proximal phalanx and the PIP joint DIP and PIP extension and flexion tendons all intact collateral ligaments PIP joint intact probably sprained the PIP joint along with a proximal phalanx fracture  Outside x-ray shows I read this as a proximal phalanx fracture nondisplaced  Recommend x-ray in 7 to 10 days  Buddy tape active range of motion  Patient will need a note for work that says she is to scan only for 3 weeks .

## 2020-09-22 NOTE — Patient Instructions (Signed)
May work with finger taped   Yesterday early release for fracture

## 2020-10-04 ENCOUNTER — Other Ambulatory Visit: Payer: Self-pay

## 2020-10-04 ENCOUNTER — Encounter: Payer: Self-pay | Admitting: Orthopedic Surgery

## 2020-10-04 ENCOUNTER — Ambulatory Visit (INDEPENDENT_AMBULATORY_CARE_PROVIDER_SITE_OTHER): Payer: Self-pay | Admitting: Orthopedic Surgery

## 2020-10-04 ENCOUNTER — Ambulatory Visit: Payer: Self-pay

## 2020-10-04 DIAGNOSIS — S62647D Nondisplaced fracture of proximal phalanx of left little finger, subsequent encounter for fracture with routine healing: Secondary | ICD-10-CM

## 2020-10-04 NOTE — Patient Instructions (Signed)
Ok to return to normal work duty

## 2020-10-04 NOTE — Progress Notes (Signed)
Chief Complaint  Patient presents with  . Hand Pain    09/18/20 left 5th finger injury fracture/ improving     Fracture   Encounter Diagnosis  Name Primary?  . Closed nondisplaced fracture of proximal phalanx of left little finger with routine healing, subsequent encounter 09/18/20 Yes    xrays look good   Alignment good   xrays good   Xray in 2 weeks   Tape together

## 2020-10-18 ENCOUNTER — Ambulatory Visit: Payer: Self-pay | Admitting: Orthopedic Surgery

## 2021-05-23 ENCOUNTER — Emergency Department (HOSPITAL_COMMUNITY)
Admission: EM | Admit: 2021-05-23 | Discharge: 2021-05-23 | Disposition: A | Discharge status: Home / Self Care | Payer: BC Managed Care – PPO | Attending: Emergency Medicine | Admitting: Emergency Medicine

## 2021-05-23 ENCOUNTER — Other Ambulatory Visit: Payer: Self-pay

## 2021-05-23 ENCOUNTER — Encounter (HOSPITAL_COMMUNITY): Payer: Self-pay | Admitting: *Deleted

## 2021-05-23 DIAGNOSIS — R079 Chest pain, unspecified: Secondary | ICD-10-CM | POA: Diagnosis present

## 2021-05-23 DIAGNOSIS — Z5321 Procedure and treatment not carried out due to patient leaving prior to being seen by health care provider: Secondary | ICD-10-CM | POA: Diagnosis not present

## 2021-05-23 DIAGNOSIS — R0602 Shortness of breath: Secondary | ICD-10-CM | POA: Diagnosis not present

## 2021-05-23 NOTE — ED Triage Notes (Signed)
Pt c/o upper chest pain that radiates to chest along with SOB. Pt has taken a total of 2 Nitro SL tablets, with relief in pain.

## 2021-07-19 ENCOUNTER — Other Ambulatory Visit: Payer: Self-pay | Admitting: Women's Health

## 2021-07-19 ENCOUNTER — Other Ambulatory Visit (HOSPITAL_COMMUNITY)
Admission: RE | Admit: 2021-07-19 | Discharge: 2021-07-19 | Disposition: A | Discharge status: Home / Self Care | Payer: BC Managed Care – PPO | Source: Ambulatory Visit | Attending: Women's Health | Admitting: Women's Health

## 2021-07-19 ENCOUNTER — Encounter: Payer: Self-pay | Admitting: Women's Health

## 2021-07-19 ENCOUNTER — Ambulatory Visit: Payer: BC Managed Care – PPO | Admitting: Women's Health

## 2021-07-19 ENCOUNTER — Other Ambulatory Visit: Payer: Self-pay

## 2021-07-19 VITALS — BP 139/81 | HR 76 | Ht 64.0 in | Wt 187.2 lb

## 2021-07-19 DIAGNOSIS — Z1212 Encounter for screening for malignant neoplasm of rectum: Secondary | ICD-10-CM | POA: Diagnosis not present

## 2021-07-19 DIAGNOSIS — Z1211 Encounter for screening for malignant neoplasm of colon: Secondary | ICD-10-CM | POA: Diagnosis not present

## 2021-07-19 DIAGNOSIS — Z01419 Encounter for gynecological examination (general) (routine) without abnormal findings: Secondary | ICD-10-CM | POA: Diagnosis not present

## 2021-07-19 DIAGNOSIS — Z1231 Encounter for screening mammogram for malignant neoplasm of breast: Secondary | ICD-10-CM

## 2021-07-19 MED ORDER — TRICHLOROACETIC ACID 80 % EX LIQD: CUTANEOUS | 0 refills | Status: DC

## 2021-07-19 NOTE — Progress Notes (Signed)
WELL-WOMAN EXAMINATION Patient name: Alyssa Morales MRN [id number]  Date of birth: 04-26-1971 Chief Complaint:   New Patient (Initial Visit)  History of Present Illness:   Alyssa Morales is a 51 y.o. G12P3003 Caucasian female being seen today for a routine well-woman exam.  Current complaints: 2 vulvar genital warts, wants gone  PCP: was [facility], he left, has appt to get [facility] w/ [facility]      does not desire labs, done recently Patient's last menstrual period was 10/01/2016. The current method of family planning is post menopausal status.  Last pap ~5-11yr ago. Results were: negative per pt report at [facility] . 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 Results were: normal. Family h/o breast cancer: yes mom @ 63yo Last colonoscopy: never. Results were: N/A. Family h/o colorectal cancer: yes [facility]  Depression screen [facility] 2/9 07/19/2021  Decreased Interest 0  Down, Depressed, Hopeless 1  PHQ - 2 Score 1  Altered sleeping 2  Tired, decreased energy 0  Change in appetite 2  Feeling bad or failure about yourself  0  Trouble concentrating 2  Moving slowly or fidgety/restless 0  Suicidal thoughts 0  PHQ-9 Score 7     GAD 7 : Generalized Anxiety Score 07/19/2021  Nervous, Anxious, on Edge 1  Control/stop worrying 2  Worry too much - different things 2  Trouble relaxing 1  Restless 2  Easily annoyed or irritable 2  Afraid - awful might happen 2  Total GAD 7 Score 12     Review of Systems:   Pertinent items are noted in HPI Denies any headaches, blurred vision, fatigue, shortness of breath, chest pain, abdominal pain, abnormal vaginal discharge/itching/odor/irritation, problems with periods, bowel movements, urination, or intercourse unless otherwise stated above. Pertinent History Reviewed:  Reviewed past medical,surgical, social and family history.  Reviewed problem list, medications and allergies. Physical Assessment:   Vitals:   07/19/21  0901  BP: 139/81  Pulse: 76  Weight: 187 lb 3.2 oz (84.9 kg)  Height: 5\' 4"  (1.626 m)  Body mass index is 32.13 kg/m.        Physical Examination:   General appearance - well appearing, and in no distress  Mental status - alert, oriented to person, place, and time  Psych:  She has a normal mood and affect  Skin - warm and dry, normal color, no suspicious lesions noted  Chest - effort normal, all lung fields clear to auscultation bilaterally  Heart - normal rate and regular rhythm  Neck:  midline trachea, no thyromegaly or nodules  Breasts - breasts appear normal, no suspicious masses, no skin or nipple changes or  axillary nodes  Abdomen - soft, nontender, nondistended, no masses or organomegaly  Pelvic - VULVA: 2 vulvar condyloma just above clitoris VAGINA: normal appearing vagina with normal color and discharge, no lesions  CERVIX: normal appearing cervix without discharge or lesions, no CMT  Thin prep pap is done w/ HR HPV cotesting  UTERUS: uterus is felt to be normal size, shape, consistency and nontender   ADNEXA: No adnexal masses or tenderness noted.  Extremities:  No swelling or varicosities noted  Chaperone: Levy Pupa    No results found for this or any previous visit (from the past 24 hour(s)).  Assessment & Plan:  1) Well-Woman Exam  2) Past due for mammogram> order placed, number given for pt to make appt  3) Past due for colonoscopy> GI referral ordered  today  4) 2 vulvar condyloma> gave option of cream vs TCA, wants TCA- rx sent (pt wants sent to [facility], discussed they may not have it, if not will resent to [place]), f/u 2wks  Labs/procedures today: pap  Mammogram: schedule screening mammo as soon as possible, or sooner if problems Colonoscopy: schedule screening colonoscopy as soon as possible, or sooner if problems  Orders Placed This Encounter  Procedures   MM 3D SCREEN BREAST BILATERAL   Ambulatory referral to Gastroenterology    Meds:   Meds ordered this encounter  Medications   trichloroacetic acid 80 % LIQD    Sig: Dispense for in office use    Dispense:  15 mL    Refill:  0    Order Specific Question:   Supervising Provider    Answer:   Florian Buff [[id number]]    Follow-up: Return in about 2 weeks (around 08/02/2021) for TCA treatment.  [place], [facility] 07/19/2021 9:37 AM

## 2021-07-19 NOTE — Addendum Note (Signed)
Addended by: Roma Schanz on: 07/19/2021 10:55 AM   Modules accepted: Orders

## 2021-07-19 NOTE — Patient Instructions (Signed)
Call [phone] to schedule mammogram

## 2021-07-20 ENCOUNTER — Telehealth: Payer: Self-pay | Admitting: Women's Health

## 2021-07-20 ENCOUNTER — Encounter: Payer: Self-pay | Admitting: Internal Medicine

## 2021-07-20 NOTE — Telephone Encounter (Signed)
Returned patients call.  Informed medication should last several doses so one time of $50 should be all she should pay at this time.  Pt verbalized understanding with no further questions.

## 2021-07-20 NOTE — Telephone Encounter (Signed)
Patient called stating that she sen Alyssa Morales yesterday and she order her a compound medication. Patient states that her pharmacy states that no insurance will pay for it and it cost $50.00, patient would like to know does she have to pay this all the time or just once. Please contact pt

## 2021-07-22 LAB — CYTOLOGY - PAP
Adequacy: ABSENT
Chlamydia: NEGATIVE
Comment: NEGATIVE
Comment: NEGATIVE
Comment: NORMAL
Diagnosis: NEGATIVE
High risk HPV: NEGATIVE
Neisseria Gonorrhea: NEGATIVE

## 2021-07-25 ENCOUNTER — Other Ambulatory Visit: Payer: Self-pay

## 2021-07-25 ENCOUNTER — Encounter: Payer: Self-pay | Admitting: Women's Health

## 2021-07-25 ENCOUNTER — Ambulatory Visit: Payer: BC Managed Care – PPO | Admitting: Women's Health

## 2021-07-25 VITALS — BP 144/84 | HR 80 | Ht 64.0 in | Wt 183.0 lb

## 2021-07-25 DIAGNOSIS — R319 Hematuria, unspecified: Secondary | ICD-10-CM | POA: Diagnosis not present

## 2021-07-25 DIAGNOSIS — A630 Anogenital (venereal) warts: Secondary | ICD-10-CM

## 2021-07-25 DIAGNOSIS — R3911 Hesitancy of micturition: Secondary | ICD-10-CM

## 2021-07-25 LAB — POCT URINALYSIS DIPSTICK
Glucose, UA: NEGATIVE
Ketones, UA: NEGATIVE
Leukocytes, UA: NEGATIVE
Nitrite, UA: NEGATIVE
Protein, UA: NEGATIVE

## 2021-07-25 NOTE — Progress Notes (Signed)
° °  GYN VISIT Patient name: Alyssa Morales MRN [id number]  Date of birth: 1971-04-28 Chief Complaint:   TCA treatment  History of Present Illness:   Alyssa Morales is a 51 y.o. G80P3003 Caucasian female being seen today for 1st TCA treatment for vulvar condyloma.  Some urinary hesitancy, didn't know if she had yeast or UTI- pap last week did not show yeast.  Patient's last menstrual period was 10/01/2016. The current method of family planning is post menopausal status.  Last pap 07/19/21. Results were: NILM w/ HRHPV negative  Depression screen [facility] 2/9 07/19/2021  Decreased Interest 0  Down, Depressed, Hopeless 1  PHQ - 2 Score 1  Altered sleeping 2  Tired, decreased energy 0  Change in appetite 2  Feeling bad or failure about yourself  0  Trouble concentrating 2  Moving slowly or fidgety/restless 0  Suicidal thoughts 0  PHQ-9 Score 7     GAD 7 : Generalized Anxiety Score 07/19/2021  Nervous, Anxious, on Edge 1  Control/stop worrying 2  Worry too much - different things 2  Trouble relaxing 1  Restless 2  Easily annoyed or irritable 2  Afraid - awful might happen 2  Total GAD 7 Score 12     Review of Systems:   Pertinent items are noted in HPI Denies fever/chills, dizziness, headaches, visual disturbances, fatigue, shortness of breath, chest pain, abdominal pain, vomiting, abnormal vaginal discharge/itching/odor/irritation, problems with periods, bowel movements, urination, or intercourse unless otherwise stated above.  Pertinent History Reviewed:  Reviewed past medical,surgical, social, obstetrical and family history.  Reviewed problem list, medications and allergies. Physical Assessment:   Vitals:   07/25/21 1330  BP: (!) 144/84  Pulse: 80  Weight: 183 lb (83 kg)  Height: 5\' 4"  (1.626 m)  Body mass index is 31.41 kg/m.       Physical Examination:   General appearance: alert, well appearing, and in no distress  Mental status: alert, oriented to person, place, and  time  Skin: warm & dry   Cardiovascular: normal heart rate noted  Respiratory: normal respiratory effort, no distress  Abdomen: soft, non-tender   Pelvic: 2 vulvar convalescing condyloma Lt periurethral area, treated w/ TCA  Extremities: no edema   Chaperone: Levy Pupa    Results for orders placed or performed in visit on 07/25/21 (from the past 24 hour(s))  POCT Urinalysis Dipstick   Collection Time: 07/25/21  1:57 PM  Result Value Ref Range   Color, UA     Clarity, UA     Glucose, UA Negative Negative   Bilirubin, UA     Ketones, UA neg    Spec Grav, UA     Blood, UA 2+    pH, UA     Protein, UA Negative Negative   Urobilinogen, UA     Nitrite, UA neg    Leukocytes, UA Negative Negative   Appearance     Odor      Assessment & Plan:  1) 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   Orders Placed This Encounter  Procedures   Urine Culture   POCT Urinalysis Dipstick    Return in about 2 weeks (around 08/08/2021) for TCA treatment (cancel 1/24 visit).  [place], [facility] 07/25/2021 2:00 PM

## 2021-07-27 LAB — URINE CULTURE

## 2021-07-27 MED ORDER — NITROFURANTOIN MONOHYD MACRO 100 MG PO CAPS: 100.0000 mg | ORAL_CAPSULE | Freq: Two times a day (BID) | ORAL | 0 refills | Status: DC

## 2021-07-27 NOTE — Addendum Note (Signed)
Addended by: Roma Schanz on: 07/27/2021 02:48 PM   Modules accepted: Orders

## 2021-08-02 ENCOUNTER — Ambulatory Visit: Payer: BC Managed Care – PPO | Admitting: Women's Health

## 2021-08-08 ENCOUNTER — Ambulatory Visit: Payer: BC Managed Care – PPO | Admitting: Women's Health

## 2021-08-09 ENCOUNTER — Encounter: Payer: Self-pay | Admitting: Women's Health

## 2021-08-09 ENCOUNTER — Other Ambulatory Visit: Payer: Self-pay

## 2021-08-09 ENCOUNTER — Ambulatory Visit (INDEPENDENT_AMBULATORY_CARE_PROVIDER_SITE_OTHER): Payer: BC Managed Care – PPO | Admitting: Women's Health

## 2021-08-09 VITALS — BP 126/82 | HR 78 | Ht 64.0 in | Wt 180.6 lb

## 2021-08-09 DIAGNOSIS — A630 Anogenital (venereal) warts: Secondary | ICD-10-CM

## 2021-08-09 NOTE — Progress Notes (Signed)
° °  GYN VISIT Patient name: Alyssa Morales MRN [id number]  Date of birth: October 14, 1970 Chief Complaint:   TCA treatment  History of Present Illness:   Alyssa Morales is a 51 y.o. G34P3003 Caucasian female being seen today for 2nd TCA treatment for vulvar condyloma.  Feels like it is 'much better, almost gone'. Finished macrobid for UTI, feels much better w/ that as well.  Patient's last menstrual period was 10/01/2016. The current method of family planning is post menopausal status.  Last pap 07/19/21. Results were: NILM w/ HRHPV negative  Depression screen [facility] 2/9 07/19/2021  Decreased Interest 0  Down, Depressed, Hopeless 1  PHQ - 2 Score 1  Altered sleeping 2  Tired, decreased energy 0  Change in appetite 2  Feeling bad or failure about yourself  0  Trouble concentrating 2  Moving slowly or fidgety/restless 0  Suicidal thoughts 0  PHQ-9 Score 7     GAD 7 : Generalized Anxiety Score 07/19/2021  Nervous, Anxious, on Edge 1  Control/stop worrying 2  Worry too much - different things 2  Trouble relaxing 1  Restless 2  Easily annoyed or irritable 2  Afraid - awful might happen 2  Total GAD 7 Score 12     Review of Systems:   Pertinent items are noted in HPI Denies fever/chills, dizziness, headaches, visual disturbances, fatigue, shortness of breath, chest pain, abdominal pain, vomiting, abnormal vaginal discharge/itching/odor/irritation, problems with periods, bowel movements, urination, or intercourse unless otherwise stated above.  Pertinent History Reviewed:  Reviewed past medical,surgical, social, obstetrical and family history.  Reviewed problem list, medications and allergies. Physical Assessment:   Vitals:   08/09/21 1205  BP: 126/82  Pulse: 78  Weight: 180 lb 9.6 oz (81.9 kg)  Height: 5\' 4"  (1.626 m)  Body mass index is 31 kg/m.       Physical Examination:   General appearance: alert, well appearing, and in no distress  Mental status: alert, oriented to person,  place, and time  Skin: warm & dry   Cardiovascular: normal heart rate noted  Respiratory: normal respiratory effort, no distress  Abdomen: soft, non-tender   Pelvic: vulvar condyloma much smaller! TCA applied  Extremities: no edema   Chaperone: Angel Neas    No results found for this or any previous visit (from the past 24 hour(s)).  Assessment & Plan:  1) Vulvar condyloma> responding nicely to TCA, will schedule next week but can call and cancel if gone  Meds: No orders of the defined types were placed in this encounter.   No orders of the defined types were placed in this encounter.   Return in about 1 week (around 08/16/2021) for TCA treatment.  [place], [facility] 08/09/2021 12:33 PM

## 2021-08-17 ENCOUNTER — Other Ambulatory Visit: Payer: Self-pay

## 2021-08-17 ENCOUNTER — Ambulatory Visit: Payer: BC Managed Care – PPO | Admitting: Adult Health

## 2021-08-17 ENCOUNTER — Encounter: Payer: Self-pay | Admitting: Adult Health

## 2021-08-17 VITALS — BP 130/73 | HR 80 | Ht 64.0 in | Wt 178.0 lb

## 2021-08-17 DIAGNOSIS — A630 Anogenital (venereal) warts: Secondary | ICD-10-CM | POA: Diagnosis not present

## 2021-08-17 NOTE — Progress Notes (Signed)
°  Subjective:     Patient ID: Alyssa Morales, female   DOB: 10-14-1970, 51 y.o.   MRN: [id number]  HPI Alyssa Morales is a 51 year old white female, separated, G3P3, PM in for third TCA treatment, for vulva condyloma on vulva area near clitoris. Lab Results  Component Value Date   DIAGPAP  07/19/2021    - Negative for intraepithelial lesion or malignancy (NILM)   [place] Negative 07/19/2021   PCP is [facility].  Review of Systems +condyloma Reviewed past medical,surgical, social and family history. Reviewed medications and allergies.     Objective:   Physical Exam BP 130/73 (BP Location: Left Arm, Patient Position: Sitting, Cuff Size: Normal)    Pulse 80    Ht 5\' 4"  (1.626 m)    Wt 178 lb (80.7 kg)    LMP 10/01/2016    BMI 30.55 kg/m     Skin warm and dry.Pelvic: external genitalia is normal in appearance no lesions, vagina: has 1 cm wart, coated with TCA 80%, left labia.  [place] - 08/17/21 1020       Pregnancy Intention Screening   Does the patient want to become pregnant in the next year? No    Does the patient's partner want to become pregnant in the next year? No    Would the patient like to discuss contraceptive options today? No      Contraception Wrap Up   Current Method --   PM   End Method --   PM   Contraception Counseling Provided No            Examination chaperoned by Balinda Quails, NP student.  Assessment:     1. Condyloma TCA applied Can wash with warm water in 1 hour     Plan:     Follow up in 1 week

## 2021-08-17 NOTE — Progress Notes (Deleted)
°  Subjective:     Patient ID: Alyssa Morales, female   DOB: [birth year], 51 y.o.   MRN: [id number]  HPI   Review of Systems     Objective:   Physical Exam     Assessment:     ***    Plan:     ***

## 2021-08-24 ENCOUNTER — Encounter: Payer: Self-pay | Admitting: Women's Health

## 2021-08-24 ENCOUNTER — Other Ambulatory Visit: Payer: Self-pay

## 2021-08-24 ENCOUNTER — Ambulatory Visit (INDEPENDENT_AMBULATORY_CARE_PROVIDER_SITE_OTHER): Payer: BC Managed Care – PPO | Admitting: Women's Health

## 2021-08-24 VITALS — BP 137/84 | HR 73 | Ht 64.0 in | Wt 180.0 lb

## 2021-08-24 DIAGNOSIS — A630 Anogenital (venereal) warts: Secondary | ICD-10-CM

## 2021-08-24 DIAGNOSIS — N393 Stress incontinence (female) (male): Secondary | ICD-10-CM

## 2021-08-24 NOTE — Progress Notes (Signed)
° °  GYN VISIT Patient name: Alyssa Morales MRN [id number]  Date of birth: 03-16-1971 Chief Complaint:   Follow-up (TCA)  History of Present Illness:   SHIRLENA BRINEGAR is a 51 y.o. G39P3003 Caucasian female being seen today for TCA tx for vulvar condyloma. 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 Interested in trying estrogen in the meantime to see if will help.  Patient's last menstrual period was 10/01/2016. The current method of family planning is post menopausal status.  Last pap 07/19/21. Results were: NILM w/ HRHPV negative  Depression screen [facility] 2/9 07/19/2021  Decreased Interest 0  Down, Depressed, Hopeless 1  PHQ - 2 Score 1  Altered sleeping 2  Tired, decreased energy 0  Change in appetite 2  Feeling bad or failure about yourself  0  Trouble concentrating 2  Moving slowly or fidgety/restless 0  Suicidal thoughts 0  PHQ-9 Score 7     GAD 7 : Generalized Anxiety Score 07/19/2021  Nervous, Anxious, on Edge 1  Control/stop worrying 2  Worry too much - different things 2  Trouble relaxing 1  Restless 2  Easily annoyed or irritable 2  Afraid - awful might happen 2  Total GAD 7 Score 12     Review of Systems:   Pertinent items are noted in HPI Denies fever/chills, dizziness, headaches, visual disturbances, fatigue, shortness of breath, chest pain, abdominal pain, vomiting, abnormal vaginal discharge/itching/odor/irritation, problems with periods, bowel movements, urination, or intercourse unless otherwise stated above.  Pertinent History Reviewed:  Reviewed past medical,surgical, social, obstetrical and family history.  Reviewed problem list, medications and allergies. Physical Assessment:   Vitals:   08/24/21 0933  BP: 137/84  Pulse: 73  Weight: 180 lb (81.6 kg)  Height: 5\' 4"  (1.626 m)  Body mass index is 30.9 kg/m.       Physical Examination:   General appearance: alert, well appearing, and in no distress  Mental status: alert, oriented to  person, place, and time  Skin: warm & dry   Cardiovascular: normal heart rate noted  Respiratory: normal respiratory effort, no distress  Abdomen: soft, non-tender   Pelvic: vulvar condyloma about the same as when I saw her last time 1/31, TCA applied  Extremities: no edema   Chaperone: Latisha Cresenzo    No results found for this or any previous visit (from the past 24 hour(s)).  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   No orders of the defined types were placed in this encounter.   Return in about 6 days (around 08/30/2021) for TCA treatment w/ me.  Roma Schanz CNM, WHNP-BC 08/24/2021 10:20 AM

## 2021-08-30 ENCOUNTER — Other Ambulatory Visit: Payer: Self-pay

## 2021-08-30 ENCOUNTER — Ambulatory Visit (INDEPENDENT_AMBULATORY_CARE_PROVIDER_SITE_OTHER): Payer: BC Managed Care – PPO | Admitting: Women's Health

## 2021-08-30 ENCOUNTER — Encounter: Payer: Self-pay | Admitting: Women's Health

## 2021-08-30 VITALS — BP 135/82 | HR 75 | Ht 64.0 in | Wt 176.0 lb

## 2021-08-30 DIAGNOSIS — A630 Anogenital (venereal) warts: Secondary | ICD-10-CM

## 2021-08-30 NOTE — Patient Instructions (Signed)
[phone] to call to schedule mammogram

## 2021-08-30 NOTE — Progress Notes (Signed)
° °  GYN VISIT Patient name: Alyssa Morales MRN [id number]  Date of birth: [birth year] Chief Complaint:   Treatment  History of Present Illness:   Alyssa Morales is a 51 y.o. 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    Patient's last menstrual period was 10/01/2016. The current method of family planning is post menopausal status.  Last pap 07/19/21. Results were: NILM w/ HRHPV negative  Depression screen [facility] 2/9 07/19/2021  Decreased Interest 0  Down, Depressed, Hopeless 1  PHQ - 2 Score 1  Altered sleeping 2  Tired, decreased energy 0  Change in appetite 2  Feeling bad or failure about yourself  0  Trouble concentrating 2  Moving slowly or fidgety/restless 0  Suicidal thoughts 0  PHQ-9 Score 7     GAD 7 : Generalized Anxiety Score 07/19/2021  Nervous, Anxious, on Edge 1  Control/stop worrying 2  Worry too much - different things 2  Trouble relaxing 1  Restless 2  Easily annoyed or irritable 2  Afraid - awful might happen 2  Total GAD 7 Score 12     Review of Systems:   Pertinent items are noted in HPI Denies fever/chills, dizziness, headaches, visual disturbances, fatigue, shortness of breath, chest pain, abdominal pain, vomiting, abnormal vaginal discharge/itching/odor/irritation, problems with periods, bowel movements, urination, or intercourse unless otherwise stated above.  Pertinent History Reviewed:  Reviewed past medical,surgical, social, obstetrical and family history.  Reviewed problem list, medications and allergies. Physical Assessment:   Vitals:   08/30/21 0947  BP: 135/82  Pulse: 75  Weight: 176 lb (79.8 kg)  Height: 5\' 4"  (1.626 m)  Body mass index is 30.21 kg/m.       Physical Examination:   General appearance: alert, well appearing, and in no distress  Mental status: alert, oriented to person, place, and time  Skin: warm & dry   Cardiovascular: normal heart rate noted  Respiratory: normal respiratory effort, no  distress  Abdomen: soft, non-tender   Pelvic: vulvar condyloma almost gone, 1 small bump left, treated w/ TCA  Extremities: no edema   Chaperone: Angel Neas    No results found for this or any previous visit (from the past 24 hour(s)).  Assessment & Plan:  1) Vulvar condyloma> 5th TCA treatment, f/u 1wk if needed  Meds: No orders of the defined types were placed in this encounter.   No orders of the defined types were placed in this encounter.   Return in about 1 week (around 09/06/2021) for TCA treatment, with me.  [place], [facility] 08/30/2021 10:32 AM

## 2021-08-31 ENCOUNTER — Emergency Department (HOSPITAL_COMMUNITY): Payer: BC Managed Care – PPO

## 2021-08-31 ENCOUNTER — Emergency Department (HOSPITAL_COMMUNITY)
Admission: EM | Admit: 2021-08-31 | Discharge: 2021-08-31 | Disposition: A | Discharge status: Home / Self Care | Payer: BC Managed Care – PPO | Attending: Emergency Medicine | Admitting: Emergency Medicine

## 2021-08-31 DIAGNOSIS — Z7984 Long term (current) use of oral hypoglycemic drugs: Secondary | ICD-10-CM | POA: Insufficient documentation

## 2021-08-31 DIAGNOSIS — E119 Type 2 diabetes mellitus without complications: Secondary | ICD-10-CM | POA: Insufficient documentation

## 2021-08-31 DIAGNOSIS — N393 Stress incontinence (female) (male): Secondary | ICD-10-CM | POA: Insufficient documentation

## 2021-08-31 DIAGNOSIS — Z7982 Long term (current) use of aspirin: Secondary | ICD-10-CM | POA: Diagnosis not present

## 2021-08-31 DIAGNOSIS — F324 Major depressive disorder, single episode, in partial remission: Secondary | ICD-10-CM | POA: Insufficient documentation

## 2021-08-31 DIAGNOSIS — K219 Gastro-esophageal reflux disease without esophagitis: Secondary | ICD-10-CM | POA: Insufficient documentation

## 2021-08-31 DIAGNOSIS — R042 Hemoptysis: Secondary | ICD-10-CM | POA: Diagnosis present

## 2021-08-31 DIAGNOSIS — Z79899 Other long term (current) drug therapy: Secondary | ICD-10-CM | POA: Insufficient documentation

## 2021-08-31 DIAGNOSIS — J449 Chronic obstructive pulmonary disease, unspecified: Secondary | ICD-10-CM | POA: Diagnosis not present

## 2021-08-31 DIAGNOSIS — F329 Major depressive disorder, single episode, unspecified: Secondary | ICD-10-CM | POA: Insufficient documentation

## 2021-08-31 DIAGNOSIS — I10 Essential (primary) hypertension: Secondary | ICD-10-CM | POA: Insufficient documentation

## 2021-08-31 DIAGNOSIS — A630 Anogenital (venereal) warts: Secondary | ICD-10-CM | POA: Insufficient documentation

## 2021-08-31 DIAGNOSIS — E785 Hyperlipidemia, unspecified: Secondary | ICD-10-CM | POA: Insufficient documentation

## 2021-08-31 DIAGNOSIS — Z6830 Body mass index (BMI) 30.0-30.9, adult: Secondary | ICD-10-CM | POA: Insufficient documentation

## 2021-08-31 DIAGNOSIS — I2510 Atherosclerotic heart disease of native coronary artery without angina pectoris: Secondary | ICD-10-CM | POA: Insufficient documentation

## 2021-08-31 LAB — COMPREHENSIVE METABOLIC PANEL
ALT: 20 U/L (ref 0–44)
AST: 16 U/L (ref 15–41)
Albumin: 4.2 g/dL (ref 3.5–5.0)
Alkaline Phosphatase: 84 U/L (ref 38–126)
Anion gap: 7 (ref 5–15)
BUN: 23 mg/dL — ABNORMAL HIGH (ref 6–20)
CO2: 26 mmol/L (ref 22–32)
Calcium: 10 mg/dL (ref 8.9–10.3)
Chloride: 105 mmol/L (ref 98–111)
Creatinine, Ser: 0.74 mg/dL (ref 0.44–1.00)
GFR, Estimated: >60 mL/min (ref >60)
Glucose, Bld: 147 mg/dL — ABNORMAL HIGH (ref 70–99)
Potassium: 3.9 mmol/L (ref 3.5–5.1)
Sodium: 138 mmol/L (ref 135–145)
Total Bilirubin: 0.6 mg/dL (ref 0.3–1.2)
Total Protein: 8 g/dL (ref 6.5–8.1)

## 2021-08-31 LAB — CBC WITH DIFFERENTIAL/PLATELET
Abs Immature Granulocytes: 0.03 10*3/uL (ref 0.00–0.07)
Basophils Absolute: 0.1 10*3/uL (ref 0.0–0.1)
Basophils Relative: 1 %
Eosinophils Absolute: 0.2 10*3/uL (ref 0.0–0.5)
Eosinophils Relative: 2 %
HCT: 41.5 % (ref 36.0–46.0)
Hemoglobin: 13.5 g/dL (ref 12.0–15.0)
Immature Granulocytes: 0 %
Lymphocytes Relative: 22 %
Lymphs Abs: 2.5 10*3/uL (ref 0.7–4.0)
MCH: 30.2 pg (ref 26.0–34.0)
MCHC: 32.5 g/dL (ref 30.0–36.0)
MCV: 92.8 fL (ref 80.0–100.0)
Monocytes Absolute: 0.6 10*3/uL (ref 0.1–1.0)
Monocytes Relative: 5 %
Neutro Abs: 8 10*3/uL — ABNORMAL HIGH (ref 1.7–7.7)
Neutrophils Relative %: 70 %
Platelets: 408 10*3/uL — ABNORMAL HIGH (ref 150–400)
RBC: 4.47 MIL/uL (ref 3.87–5.11)
RDW: 13.3 % (ref 11.5–15.5)
WBC: 11.4 10*3/uL — ABNORMAL HIGH (ref 4.0–10.5)
nRBC: 0 % (ref 0.0–0.2)

## 2021-08-31 MED ORDER — HYDROXYZINE HCL 25 MG PO TABS
50.0000 mg | ORAL_TABLET | Freq: Once | ORAL | Status: AC
  Administered 2021-08-31: 50 mg via ORAL

## 2021-08-31 MED ORDER — IOHEXOL 350 MG/ML SOLN
75.0000 mL | Freq: Once | INTRAVENOUS | Status: AC | PRN
  Administered 2021-08-31: 75 mL via INTRAVENOUS

## 2021-08-31 NOTE — ED Provider Notes (Signed)
[facility] EMERGENCY DEPARTMENT Provider Note   CSN: [id number] Arrival date & time: 08/31/21  1129     History  Chief Complaint  Patient presents with   Hemoptysis    Alyssa Morales is a 51 y.o. female.  51 yo woman presents today with 3 episodes of hemoptysis. She woke up from sleep at 3 AM with coughing, then had BRB with a small clot. This happened 3 more times this morning, including here in the ED. Patient has never had this before, does not have cough at baseline. She denies any runny nose, sore throat, cough, fever, chills, n/v/d. 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 She has not had recent surgery, no long trips or plane rides. She has a >20 pack year smoking history. She has never been diagnosed with COPD, but not worked up for it. She does have wheezing occasionally and uses albuterol. She denies hematochezia and melena.      Home Medications Prior to Admission medications   Medication Sig Start Date End Date Taking? Authorizing Provider  albuterol (PROVENTIL HFA;VENTOLIN HFA) 108 (90 BASE) MCG/ACT inhaler Inhale 1 puff into the lungs every 6 (six) hours as needed for wheezing or shortness of breath.    [provider]  aspirin EC 81 MG EC tablet Take 1 tablet (81 mg total) by mouth daily. 11/03/14   Dunn, Nedra Hai, PA-C  buPROPion (WELLBUTRIN XL) 300 MG 24 hr tablet Take 300 mg by mouth every morning. 07/08/21   [provider]  Cholecalciferol (VITAMIN D3 PO) Take 5,000 Int'l Units/day by mouth.    [provider]  hydrOXYzine (ATARAX) 10 MG tablet Take 10 mg by mouth every 8 (eight) hours as needed. 08/05/21   [provider]  lisinopril (PRINIVIL,ZESTRIL) 20 MG tablet Take 1 tablet (20 mg total) by mouth daily. 11/01/17   Nat Christen, MD  metFORMIN (GLUCOPHAGE) 500 MG tablet Take 2 tablets (1,000 mg total) by mouth 2 (two) times daily with a  meal. Patient taking differently: Take 500 mg by mouth 2 (two) times daily with a meal. 11/01/17   Nat Christen, MD  Multiple Vitamins-Minerals (WOMENS MULTIVITAMIN PO) Take by mouth.    [provider]  naproxen (NAPROSYN) 500 MG tablet Take 500 mg by mouth 2 (two) times daily. 07/08/21   [provider]  nitroGLYCERIN (NITROSTAT) 0.4 MG SL tablet Place 1 tablet (0.4 mg total) under the tongue every 5 (five) minutes as needed for chest pain. 11/01/17   Nat Christen, MD  omega-3 acid ethyl esters (LOVAZA) 1 G capsule Take 2 capsules (2 g total) by mouth 2 (two) times daily. Patient taking differently: Take 2 g by mouth daily. 11/02/14   Dunn, Nedra Hai, PA-C  pravastatin (PRAVACHOL) 40 MG tablet Take 40 mg by mouth daily. 09/16/20   [provider]  Semaglutide (OZEMPIC, 0.25 OR 0.5 MG/DOSE, [place]) Inject 0.25 mg into the skin once a week.    [provider]  trichloroacetic acid 80 % LIQD Dispense for in office use 07/19/21   Roma Schanz, CNM      Allergies    Patient has no known allergies.    Review of Systems   Review of Systems  Constitutional:  Negative for appetite change, chills and fever.  HENT:  Negative for rhinorrhea, sneezing and sore throat.   Respiratory:  Positive for cough. Negative for shortness of breath.  Cardiovascular:  Negative for chest pain.  Gastrointestinal:  Negative for abdominal pain, constipation, diarrhea, nausea and vomiting.  All other systems reviewed and are negative.  Physical Exam Updated Vital Signs BP (!) 147/73 (BP Location: Right Arm)    Pulse 78    Temp 97.8 F (36.6 C) (Oral)    Resp 18    LMP 10/01/2016    SpO2 98%  Physical Exam Vitals and nursing note reviewed.  Constitutional:      General: She is not in acute distress.    Appearance: Normal appearance. She is not ill-appearing.  HENT:     Head: Normocephalic and atraumatic.     Nose: Nose normal. No congestion.     Mouth/Throat:     Mouth: Mucous  membranes are moist.     Pharynx: Oropharynx is clear. No oropharyngeal exudate or posterior oropharyngeal erythema.  Eyes:     General: No scleral icterus.    Conjunctiva/sclera: Conjunctivae normal.     Pupils: Pupils are equal, round, and reactive to light.  Cardiovascular:     Rate and Rhythm: Normal rate and regular rhythm.     Pulses: Normal pulses.     Heart sounds: Normal heart sounds.  Pulmonary:     Effort: Pulmonary effort is normal.     Breath sounds: Wheezing present.  Abdominal:     General: Abdomen is flat. Bowel sounds are normal. There is no distension.     Palpations: Abdomen is soft.     Tenderness: There is no abdominal tenderness. There is no guarding.  Musculoskeletal:     Cervical back: Neck supple.     Right lower leg: No edema.     Left lower leg: No edema.  Lymphadenopathy:     Cervical: No cervical adenopathy.  Skin:    General: Skin is warm and dry.     Capillary Refill: Capillary refill takes less than 2 seconds.  Neurological:     General: No focal deficit present.     Mental Status: She is alert and oriented to person, place, and time.  Psychiatric:        Mood and Affect: Mood normal.        Behavior: Behavior normal.    ED Results / Procedures / Treatments   Labs (all labs ordered are listed, but only abnormal results are displayed) Labs Reviewed  CBC WITH DIFFERENTIAL/PLATELET - Abnormal; Notable for the following components:      Result Value   WBC 11.4 (*)    Platelets 408 (*)    Neutro Abs 8.0 (*)    All other components within normal limits  COMPREHENSIVE METABOLIC PANEL - Abnormal; Notable for the following components:   Glucose, Bld 147 (*)    BUN 23 (*)    All other components within normal limits    EKG None  Radiology DG Chest 2 View  Result Date: 08/31/2021 CLINICAL DATA:  Hemoptysis.  Productive cough EXAM: CHEST - 2 VIEW COMPARISON:  11/01/2017 FINDINGS: The heart size and mediastinal contours are within normal  limits. Both lungs are clear. The visualized skeletal structures are unremarkable. IMPRESSION: No active cardiopulmonary disease. Electronically Signed   By: Franchot Gallo M.D.   On: 08/31/2021 12:42   CT Angio Chest PE W/Cm &/Or Wo Cm  Result Date: 08/31/2021 CLINICAL DATA:  Hemoptysis, positive D-dimer EXAM: CT ANGIOGRAPHY CHEST WITH CONTRAST TECHNIQUE: Multidetector CT imaging of the chest was performed using the standard protocol during bolus administration of intravenous contrast. Multiplanar CT  image reconstructions and MIPs were obtained to evaluate the vascular anatomy. RADIATION DOSE REDUCTION: This exam was performed according to the departmental dose-optimization program which includes automated exposure control, adjustment of the mA and/or kV according to patient size and/or use of iterative reconstruction technique. CONTRAST:  85mL OMNIPAQUE IOHEXOL 350 MG/ML SOLN COMPARISON:  Same-day chest radiograph FINDINGS: Cardiovascular: There is adequate opacification of the pulmonary arteries to the segmental level. There is no evidence of pulmonary embolism. The heart is not enlarged. There is no pericardial effusion. The thoracic aorta is normal in appearance. Mediastinum/Nodes: The thyroid is unremarkable. The esophagus is grossly unremarkable. There is no mediastinal, hilar, or axillary lymphadenopathy. Lungs/Pleura: Trachea and central airways are patent. There is extensive mosaic attenuation throughout the lungs. There is no focal consolidation or pulmonary edema. There is no pleural effusion or pneumothorax. Upper Abdomen: Cholecystectomy clips are noted. The imaged portions of the upper abdominal viscera are otherwise unremarkable. Musculoskeletal: There is no acute osseous abnormality or aggressive osseous lesion. Review of the MIP images confirms the above findings. IMPRESSION: 1. No evidence of pulmonary embolism. 2. Mosaic attenuation throughout the lungs is suggestive of air trapping/small  airway disease. Electronically Signed   By: Valetta Mole M.D.   On: 08/31/2021 15:32    Procedures Procedures    Medications Ordered in ED Medications  hydrOXYzine (ATARAX) tablet 50 mg (50 mg Oral Given 08/31/21 1447)  iohexol (OMNIPAQUE) 350 MG/ML injection 75 mL (75 mLs Intravenous Contrast Given 08/31/21 1504)    ED Course/ Medical Decision Making/ A&P Clinical Course as of 08/31/21 1556  Wed Aug 31, 2021  1446 Mild leukocytosis, patient notes that she "always" has mildly elevated WBC, which is consistent with chart review. CMP not notable. [CM]  0177 CT a/p shows no PE and no malignancy, notable for mosaicism and small airway disease, most c/w COPD. As hgb is stable, infectious cause unlikely, patient would like to go home. Recommend she follows up with PCP and referral to pulmonology. [CM]    Clinical Course User Index [CM] Gladys Damme, MD                           Medical Decision Making 51 yo woman with >20 pack-year smoking history presents today with sudden onset of cough with hemoptysis. Leading ddx is malignancy or PE. Infectious is less likely given that CXR shows no e/o TB, no symptoms to support respiratory infection before this morning. CXR is clear, however given demographics and significant symptoms, using shared decision makign with the patient, will obtain CTA chest to r/o PE and assess for malignancy. Offered patient duoneb treatment, she declined. Will need close outpatient follow up. Hgb WNL.  Amount and/or Complexity of Data Reviewed Independent Historian: spouse Labs: ordered. Radiology: ordered.  Risk Prescription drug management.          Final Clinical Impression(s) / ED Diagnoses Final diagnoses:  Hemoptysis    Rx / DC Orders ED Discharge Orders     None         Gladys Damme, MD 08/31/21 1556    Elnora Morrison, MD 09/04/21 2342

## 2021-08-31 NOTE — ED Triage Notes (Signed)
Cough up BRB x 3 episodes of last pm. No hx of similar, denies cp or sob or uri.

## 2021-08-31 NOTE — Discharge Instructions (Addendum)
I recommend you do not use ibuprofen or similar products on a daily basis as this can cause kidney injury and stomach ulcers. The CT of your chest did not find a blood clot or signs of cancer. You likely do have lung disease like COPD. I recommend you follow up with your primary care and see a pulmonologist (lung doctor).   If you have more coughing up blood with clots the size of a quarter or larger, or you cough up about 1 cup of blood in 24 hours, you have dizziness, or trouble catching your breath, please return for evaluation.

## 2021-09-05 ENCOUNTER — Other Ambulatory Visit: Payer: Self-pay

## 2021-09-05 ENCOUNTER — Encounter: Payer: Self-pay | Admitting: Women's Health

## 2021-09-05 ENCOUNTER — Ambulatory Visit: Payer: BC Managed Care – PPO | Admitting: Women's Health

## 2021-09-05 ENCOUNTER — Ambulatory Visit: Payer: BC Managed Care – PPO | Admitting: Emergency Medicine

## 2021-09-05 VITALS — BP 137/89 | HR 77 | Ht 64.0 in | Wt 173.0 lb

## 2021-09-05 DIAGNOSIS — A630 Anogenital (venereal) warts: Secondary | ICD-10-CM

## 2021-09-05 NOTE — Progress Notes (Signed)
° °  GYN VISIT Patient name: Alyssa Morales MRN [id number]  Date of birth: [birth year] Chief Complaint:   TCA treatment  History of Present Illness:   Alyssa Morales is a 51 y.o. G37P3003 Caucasian female being seen today for 6th TCA tx for vulvar condyloma. States she hasn't noticed much difference from last visit.     Patient's last menstrual period was 10/01/2016. The current method of family planning is post menopausal status.  Last pap 07/19/21. Results were: NILM w/ HRHPV negative  Depression screen [facility] 2/9 07/19/2021  Decreased Interest 0  Down, Depressed, Hopeless 1  PHQ - 2 Score 1  Altered sleeping 2  Tired, decreased energy 0  Change in appetite 2  Feeling bad or failure about yourself  0  Trouble concentrating 2  Moving slowly or fidgety/restless 0  Suicidal thoughts 0  PHQ-9 Score 7     GAD 7 : Generalized Anxiety Score 07/19/2021  Nervous, Anxious, on Edge 1  Control/stop worrying 2  Worry too much - different things 2  Trouble relaxing 1  Restless 2  Easily annoyed or irritable 2  Afraid - awful might happen 2  Total GAD 7 Score 12     Review of Systems:   Pertinent items are noted in HPI Denies fever/chills, dizziness, headaches, visual disturbances, fatigue, shortness of breath, chest pain, abdominal pain, vomiting, abnormal vaginal discharge/itching/odor/irritation, problems with periods, bowel movements, urination, or intercourse unless otherwise stated above.  Pertinent History Reviewed:  Reviewed past medical,surgical, social, obstetrical and family history.  Reviewed problem list, medications and allergies. Physical Assessment:   Vitals:   09/05/21 1042  BP: 137/89  Pulse: 77  Weight: 173 lb (78.5 kg)  Height: 5\' 4"  (1.626 m)  Body mass index is 29.7 kg/m.       Physical Examination:   General appearance: alert, well appearing, and in no distress  Mental status: alert, oriented to person, place, and time  Skin: warm & dry   Cardiovascular:  normal heart rate noted  Respiratory: normal respiratory effort, no distress  Abdomen: soft, non-tender   Pelvic: vulvar condyloma almost gone, 1 small bump left- no real improvement from last week. Treated w/ TCA  Extremities: no edema   Chaperone: Levy Pupa    No results found for this or any previous visit (from the past 24 hour(s)).  Assessment & Plan:  1) Vulvar condyloma> 6th TCA treatment today. Discussed this is likely the best we will be able to get the area w/ the TCA. Discussed imiquimod cream. Pt prefers to come back again in another week.  *After visit, searched UpToDate, recommends max of 6 TCA treatments then switching to a different method, sent MyChart message and recommended trying imiquimod, awaiting her response  Meds: No orders of the defined types were placed in this encounter.   No orders of the defined types were placed in this encounter.  [place], [facility] 09/05/2021 12:38 PM

## 2021-09-06 ENCOUNTER — Telehealth: Payer: Self-pay | Admitting: *Deleted

## 2021-09-06 ENCOUNTER — Ambulatory Visit: Payer: BC Managed Care – PPO | Admitting: Women's Health

## 2021-09-06 ENCOUNTER — Ambulatory Visit (INDEPENDENT_AMBULATORY_CARE_PROVIDER_SITE_OTHER): Payer: Self-pay | Admitting: *Deleted

## 2021-09-06 VITALS — Ht 64.0 in | Wt 173.2 lb

## 2021-09-06 DIAGNOSIS — Z1211 Encounter for screening for malignant neoplasm of colon: Secondary | ICD-10-CM

## 2021-09-06 NOTE — Telephone Encounter (Signed)
-----   Message from Roma Schanz, [place] sent at 09/06/2021  3:00 PM EST ----- Hasn't read [employer]. Please call and read it to her/have her read it. Thanks

## 2021-09-06 NOTE — Telephone Encounter (Signed)
Spoke with pt. She would like for Kim to send in the cream. Thanks!! [place]

## 2021-09-06 NOTE — Progress Notes (Addendum)
Pt informed me that she is having trouble with nausea and sour burps. She wanted to get something to help control these symptoms.  Would like ov with [place] before scheduling colonoscopy.  Scheduled virtual for 09/14/2021 at 1:00 with Neil Crouch, PA-C.

## 2021-09-12 ENCOUNTER — Encounter: Payer: Self-pay | Admitting: Women's Health

## 2021-09-12 ENCOUNTER — Ambulatory Visit: Payer: BC Managed Care – PPO | Admitting: Women's Health

## 2021-09-12 ENCOUNTER — Other Ambulatory Visit: Payer: Self-pay

## 2021-09-12 VITALS — BP 117/75 | HR 80 | Wt 170.0 lb

## 2021-09-12 DIAGNOSIS — A630 Anogenital (venereal) warts: Secondary | ICD-10-CM

## 2021-09-12 NOTE — Progress Notes (Signed)
? ?  GYN VISIT ?Patient name: Alyssa Morales MRN [id number]  Date of birth: 12/03/1970 ?Chief Complaint:   ?No chief complaint on file. ? ?History of Present Illness:   ?Alyssa Morales is a 51 y.o. G90P3003 Caucasian female being seen today for 7th TCA treatment. Discussed w/ [facility] after last (6th treatment) visit, states ok to continue weekly TCA tx. States she is pleased w/ results from last week, is almost gone. Wants to schedule 1 more week after this just in case.   ?Patient's last menstrual period was 10/01/2016. ?The current method of family planning is post menopausal status.  ?Last pap 07/19/21. Results were: NILM w/ HRHPV negative ? ?Depression screen [facility] 2/9 07/19/2021  ?Decreased Interest 0  ?Down, Depressed, Hopeless 1  ?PHQ - 2 Score 1  ?Altered sleeping 2  ?Tired, decreased energy 0  ?Change in appetite 2  ?Feeling bad or failure about yourself  0  ?Trouble concentrating 2  ?Moving slowly or fidgety/restless 0  ?Suicidal thoughts 0  ?PHQ-9 Score 7  ? ?  ?GAD 7 : Generalized Anxiety Score 07/19/2021  ?Nervous, Anxious, on Edge 1  ?Control/stop worrying 2  ?Worry too much - different things 2  ?Trouble relaxing 1  ?Restless 2  ?Easily annoyed or irritable 2  ?Afraid - awful might happen 2  ?Total GAD 7 Score 12  ? ? ? ?Review of Systems:   ?Pertinent items are noted in HPI ?Denies fever/chills, dizziness, headaches, visual disturbances, fatigue, shortness of breath, chest pain, abdominal pain, vomiting, abnormal vaginal discharge/itching/odor/irritation, problems with periods, bowel movements, urination, or intercourse unless otherwise stated above.  ?Pertinent History Reviewed:  ?Reviewed past medical,surgical, social, obstetrical and family history.  ?Reviewed problem list, medications and allergies. ?Physical Assessment:  ? ?Vitals:  ? 09/12/21 1201  ?BP: 117/75  ?Pulse: 80  ?Weight: 170 lb (77.1 kg)  ?Body mass index is 29.18 kg/m?. ? ?     Physical Examination:  ? General appearance: alert, well appearing,  and in no distress ? Mental status: alert, oriented to person, place, and time ? Skin: warm & dry  ? Cardiovascular: normal heart rate noted ? Respiratory: normal respiratory effort, no distress ? Abdomen: soft, non-tender  ? Pelvic: vulvar condyloma almost gone, very small bump left, treated w/ TCA ? Extremities: no edema  ? ?Chaperone:  Malissa Hippo, CMA    ? ?No results found for this or any previous visit (from the past 24 hour(s)).  ?Assessment & Plan:  ?1) Vulvar condyloma> 7th TCA tx, almost completely resolved, pt wants to schedule next week just in case ? ?Meds: No orders of the defined types were placed in this encounter. ? ? ?No orders of the defined types were placed in this encounter. ? ? ?Return in about 1 week (around 09/19/2021) for TCA treatment. ? ?Roma Schanz CNM, WHNP-BC ?09/12/2021 ?12:18 PM  ?

## 2021-09-13 NOTE — H&P (View-Only) (Signed)
? ? ? ? ?Primary Care Physician:  [facility] ?Primary GI:  Elon Alas. Abbey Chatters, DO ? ?Patient Location: Home ? ?Provider Location: [place] office ? ?Reason for Visit:  ?Chief Complaint  ?Patient presents with  ? Colonoscopy  ? ? ?Persons present on the virtual encounter, with roles: Patient, myself (provider),Tammy Simms, CMA (updated meds and allergies) ? ?Total time (minutes) spent on medical discussion: 10 minutes ? ?Due to COVID-19, visit was conducted using Mychart video method.  Visit was requested by patient. ? ?Virtual Visit via mychart video ? ?I connected with Franchot Gallo on 09/14/21 at  1:00 PM EST by mychart video and verified that I am speaking with the correct person using two identifiers. ?  ?I discussed the limitations, risks, security and privacy concerns of performing an evaluation and management service by telephone/video and the availability of in person appointments. I also discussed with the patient that there may be a patient responsible charge related to this service. The patient expressed understanding and agreed to proceed. ? ? ?HPI:   ?Alyssa Morales is a 51 y.o. female who presents for virtual visit regarding nausea, sour burps.  We received referral for screening colonoscopy, requested by Wells Guiles, CNM, but on triage, patient was having issues and wanted to have an evaluation in the office first. ? ?Patient seen in the ED last month with positive D-dimer, hemoptysis.  CTA chest was negative for pulmonary embolus.  Mosaic attenuation throughout the lungs suggestive of air trapping/small airway disease.  Patient was advised that she needed to follow-up with pulmonology. Patient has appt with PCP to discuss further. She has some DOE with walking up hills or with heavy repetitive lifting at work. Otherwise no shortness of breath.  ? ?From GI standpoint, she is doing okay. She had been having sour burps but symptoms improved and she thinks was medication  related. She has chronic GERD on omeprazole '40mg'$  daily for years. No prior EGD. She has occasional issues with swallowing, specifically with ketchup. Often liquids goes down the wrong way and she gets strangled. No vomiting. She has been working to lose weight. Weighed 170 last week at her provider, down from 190 pounds in 06/2021. BMs are usually loose, 1-2 per day. Thinks it is due to her DM medications. On Novolin N and metformin right now. She had run out of her Humulin recently, was trying to transition to [place] but having insurance coverage issues. During this time her metformin was also increased to '1000mg'$  BID. While off Novolin her stools were better and bloating improved. Also started Ozempic four weeks ago. Generally 30 minutes after taking metformin she has abdominal cramping followed by loose stool. No melena, brbpr.  ? ?No prior colonoscopy. Maternal uncle had polyps. ?   ? ?Current Outpatient Medications  ?Medication Sig Dispense Refill  ? albuterol (PROVENTIL HFA;VENTOLIN HFA) 108 (90 BASE) MCG/ACT inhaler Inhale 1 puff into the lungs every 6 (six) hours as needed for wheezing or shortness of breath.    ? aspirin EC 81 MG EC tablet Take 1 tablet (81 mg total) by mouth daily. 30 tablet 11  ? buPROPion (WELLBUTRIN XL) 300 MG 24 hr tablet Take 300 mg by mouth every morning.    ? Cholecalciferol (VITAMIN D3 PO) Take 5,000 Int'l Units/day by mouth.    ? hydrOXYzine (ATARAX) 25 MG tablet Take 10 mg by mouth every 8 (eight) hours as needed.    ? lisinopril (PRINIVIL,ZESTRIL) 20 MG tablet Take  1 tablet (20 mg total) by mouth daily. 30 tablet 2  ? metFORMIN (GLUCOPHAGE) 500 MG tablet Take 2 tablets (1,000 mg total) by mouth 2 (two) times daily with a meal. (Patient taking differently: Take 500 mg by mouth 2 (two) times daily with a meal. Takes one tablet 2 times daily.  1000 mg total daily.) 60 tablet 2  ? Multiple Vitamins-Minerals (WOMENS MULTIVITAMIN PO) Take by mouth.    ? naproxen (NAPROSYN) 500 MG  tablet Take 500 mg by mouth 2 (two) times daily.    ? nitroGLYCERIN (NITROSTAT) 0.4 MG SL tablet Place 1 tablet (0.4 mg total) under the tongue every 5 (five) minutes as needed for chest pain. 30 tablet 1  ? omega-3 acid ethyl esters (LOVAZA) 1 G capsule Take 2 capsules (2 g total) by mouth 2 (two) times daily. (Patient taking differently: Take 2 g by mouth daily. Takes once daily.) 120 capsule 3  ? omeprazole (PRILOSEC) 40 MG capsule Take 40 mg by mouth daily.    ? pravastatin (PRAVACHOL) 40 MG tablet Take 40 mg by mouth daily.    ? Semaglutide (OZEMPIC, 0.25 OR 0.5 MG/DOSE, [place]) Inject 0.25 mg into the skin once a week.    ? trichloroacetic acid 80 % LIQD Dispense for in office use 15 mL 0  ? ?No current facility-administered medications for this visit.  ? ? ?Past Medical History:  ?Diagnosis Date  ? CAD (coronary artery disease)   ? a. NSTEMI (peak trop 0.06) 10/2014 - cath showing first diagonal with 50-70% ostial narrowing, small in distribution, widely patent LAD/LCx/RCA, EF normal.  ? COPD (chronic obstructive pulmonary disease) ([place])   ? Diabetes mellitus with complication ([place])   ? Dyslipidemia   ? Hypertension   ? Hypertriglyceridemia   ? NSTEMI (non-ST elevated myocardial infarction) ([place])   ? Obesity   ? Tobacco abuse   ? ? ?Past Surgical History:  ?Procedure Laterality Date  ? CESAREAN SECTION    ? CHOLECYSTECTOMY    ? LEFT HEART CATHETERIZATION WITH CORONARY ANGIOGRAM N/A 11/02/2014  ? Procedure: LEFT HEART CATHETERIZATION WITH CORONARY ANGIOGRAM;  Surgeon: Belva Crome, MD;  [facility] LAB;  Service: Cardiovascular;  Laterality: N/A;  ? ? ?Family History  ?Problem Relation Age of Onset  ? Hypertension Mother   ? Diabetes Mother   ? Breast cancer Mother   ? Coronary artery disease Father   ? Diabetes Maternal Grandmother   ? Leukemia Cousin   ? Colon polyps Maternal Uncle   ? Colon cancer Neg Hx   ? ? ?Social History  ? ?Socioeconomic History  ? Marital status: Legally Separated  ?  Spouse name:  Not on file  ? Number of children: Not on file  ? Years of education: Not on file  ? Highest education level: Not on file  ?Occupational History  ? Not on file  ?Tobacco Use  ? Smoking status: Every Day  ?  Packs/day: 0.75  ?  Years: 29.00  ?  Pack years: 21.75  ?  Types: Cigarettes  ?  Start date: 11/09/1984  ? Smokeless tobacco: Never  ?Vaping Use  ? Vaping Use: Never used  ?Substance and Sexual Activity  ? Alcohol use: Yes  ?  Comment: once every 3 months  ? Drug use: Yes  ?  Types: Marijuana  ? Sexual activity: Yes  ?  Birth control/protection: Post-menopausal  ?Other Topics Concern  ? Not on file  ?Social History Narrative  ? Not on file  ? ?  Social Determinants of Health  ? ?Financial Resource Strain: Medium Risk  ? Difficulty of Paying Living Expenses: Somewhat hard  ?Food Insecurity: Food Insecurity Present  ? Worried About Charity fundraiser in the Last Year: Sometimes true  ? Ran Out of Food in the Last Year: Sometimes true  ?Transportation Needs: No Transportation Needs  ? Lack of Transportation (Medical): No  ? Lack of Transportation (Non-Medical): No  ?Physical Activity: Insufficiently Active  ? Days of Exercise per Week: 3 days  ? Minutes of Exercise per Session: 30 min  ?Stress: Stress Concern Present  ? Feeling of Stress : To some extent  ?Social Connections: Moderately Isolated  ? Frequency of Communication with Friends and Family: Three times a week  ? Frequency of Social Gatherings with Friends and Family: Once a week  ? Attends Religious Services: 1 to 4 times per year  ? Active Member of Clubs or Organizations: No  ? Attends Archivist Meetings: Never  ? Marital Status: Separated  ?Intimate Partner Violence: Not At Risk  ? Fear of Current or Ex-Partner: No  ? Emotionally Abused: No  ? Physically Abused: No  ? Sexually Abused: No  ? ? ?  ?ROS: ? ?General: Negative for anorexia, unintentional weight loss, fever, chills, fatigue, weakness. ?Eyes: Negative for vision changes.  ?ENT: Negative  for hoarseness,  nasal congestion. See hpi ?CV: Negative for chest pain, angina, palpitations, dyspnea on exertion, peripheral edema.  ?Respiratory: Negative for dyspnea at rest,  cough, sputum, wheezing. Se

## 2021-09-13 NOTE — Progress Notes (Signed)
Primary Care Physician:  [facility] Primary GI:  Elon Alas. Abbey Chatters, DO  Patient Location: Home  Provider Location: [place] office  Reason for Visit:  Chief Complaint  Patient presents with   Colonoscopy    Persons present on the virtual encounter, with roles: Patient, myself (provider),Tammy Clifton, CMA (updated meds and allergies)  Total time (minutes) spent on medical discussion: 10 minutes  Due to COVID-19, visit was conducted using Mychart video method.  Visit was requested by patient.  Virtual Visit via [employer]  I connected with Franchot Gallo on 09/14/21 at  1:00 PM EST by mychart video and verified that I am speaking with the correct person using two identifiers.   I discussed the limitations, risks, security and privacy concerns of performing an evaluation and management service by telephone/video and the availability of in person appointments. I also discussed with the patient that there may be a patient responsible charge related to this service. The patient expressed understanding and agreed to proceed.   HPI:   Alyssa Morales is a 51 y.o. female who presents for virtual visit regarding nausea, sour burps.  We received referral for screening colonoscopy, requested by Wells Guiles, CNM, but on triage, patient was having issues and wanted to have an evaluation in the office first.  Patient seen in the ED last month with positive D-dimer, hemoptysis.  CTA chest was negative for pulmonary embolus.  Mosaic attenuation throughout the lungs suggestive of air trapping/small airway disease.  Patient was advised that she needed to follow-up with pulmonology. Patient has appt with PCP to discuss further. She has some DOE with walking up hills or with heavy repetitive lifting at work. Otherwise no shortness of breath.   From GI standpoint, she is doing okay. She had been having sour burps but symptoms improved and she thinks was medication  related. She has chronic GERD on omeprazole '40mg'$  daily for years. No prior EGD. She has occasional issues with swallowing, specifically with ketchup. Often liquids goes down the wrong way and she gets strangled. No vomiting. She has been working to lose weight. Weighed 170 last week at her provider, down from 190 pounds in 06/2021. BMs are usually loose, 1-2 per day. Thinks it is due to her DM medications. On Novolin N and metformin right now. She had run out of her Humulin recently, was trying to transition to [place] but having insurance coverage issues. During this time her metformin was also increased to '1000mg'$  BID. While off Novolin her stools were better and bloating improved. Also started Ozempic four weeks ago. Generally 30 minutes after taking metformin she has abdominal cramping followed by loose stool. No melena, brbpr.   No prior colonoscopy. Maternal uncle had polyps.     Current Outpatient Medications  Medication Sig Dispense Refill   albuterol (PROVENTIL HFA;VENTOLIN HFA) 108 (90 BASE) MCG/ACT inhaler Inhale 1 puff into the lungs every 6 (six) hours as needed for wheezing or shortness of breath.     aspirin EC 81 MG EC tablet Take 1 tablet (81 mg total) by mouth daily. 30 tablet 11   buPROPion (WELLBUTRIN XL) 300 MG 24 hr tablet Take 300 mg by mouth every morning.     Cholecalciferol (VITAMIN D3 PO) Take 5,000 Int'l Units/day by mouth.     hydrOXYzine (ATARAX) 25 MG tablet Take 10 mg by mouth every 8 (eight) hours as needed.     lisinopril (PRINIVIL,ZESTRIL) 20 MG tablet Take  1 tablet (20 mg total) by mouth daily. 30 tablet 2   metFORMIN (GLUCOPHAGE) 500 MG tablet Take 2 tablets (1,000 mg total) by mouth 2 (two) times daily with a meal. (Patient taking differently: Take 500 mg by mouth 2 (two) times daily with a meal. Takes one tablet 2 times daily.  1000 mg total daily.) 60 tablet 2   Multiple Vitamins-Minerals (WOMENS MULTIVITAMIN PO) Take by mouth.     naproxen (NAPROSYN) 500 MG  tablet Take 500 mg by mouth 2 (two) times daily.     nitroGLYCERIN (NITROSTAT) 0.4 MG SL tablet Place 1 tablet (0.4 mg total) under the tongue every 5 (five) minutes as needed for chest pain. 30 tablet 1   omega-3 acid ethyl esters (LOVAZA) 1 G capsule Take 2 capsules (2 g total) by mouth 2 (two) times daily. (Patient taking differently: Take 2 g by mouth daily. Takes once daily.) 120 capsule 3   omeprazole (PRILOSEC) 40 MG capsule Take 40 mg by mouth daily.     pravastatin (PRAVACHOL) 40 MG tablet Take 40 mg by mouth daily.     Semaglutide (OZEMPIC, 0.25 OR 0.5 MG/DOSE, [place]) Inject 0.25 mg into the skin once a week.     trichloroacetic acid 80 % LIQD Dispense for in office use 15 mL 0   No current facility-administered medications for this visit.    Past Medical History:  Diagnosis Date   CAD (coronary artery disease)    a. NSTEMI (peak trop 0.06) 10/2014 - cath showing first diagonal with 50-70% ostial narrowing, small in distribution, widely patent LAD/LCx/RCA, EF normal.   COPD (chronic obstructive pulmonary disease) ([facility])    Diabetes mellitus with complication ([facility])    Dyslipidemia    Hypertension    Hypertriglyceridemia    NSTEMI (non-ST elevated myocardial infarction) ([place])    Obesity    Tobacco abuse     Past Surgical History:  Procedure Laterality Date   CESAREAN SECTION     CHOLECYSTECTOMY     LEFT HEART CATHETERIZATION WITH CORONARY ANGIOGRAM N/A 11/02/2014   Procedure: LEFT HEART CATHETERIZATION WITH CORONARY ANGIOGRAM;  Surgeon: Belva Crome, MD;  Location: [facility] CATH LAB;  Service: Cardiovascular;  Laterality: N/A;    Family History  Problem Relation Age of Onset   Hypertension Mother    Diabetes Mother    Breast cancer Mother    Coronary artery disease Father    Diabetes Maternal Grandmother    Leukemia Cousin    Colon polyps Maternal Uncle    Colon cancer Neg Hx     Social History   Socioeconomic History   Marital status: Legally Separated    Spouse name:  Not on file   Number of children: Not on file   Years of education: Not on file   Highest education level: Not on file  Occupational History   Not on file  Tobacco Use   Smoking status: Every Day    Packs/day: 0.75    Years: 29.00    Pack years: 21.75    Types: Cigarettes    Start date: 11/09/1984   Smokeless tobacco: Never  Vaping Use   Vaping Use: Never used  Substance and Sexual Activity   Alcohol use: Yes    Comment: once every 3 months   Drug use: Yes    Types: Marijuana   Sexual activity: Yes    Birth control/protection: Post-menopausal  Other Topics Concern   Not on file  Social History Narrative   Not on file  Social Determinants of Health   Financial Resource Strain: Medium Risk   Difficulty of Paying Living Expenses: Somewhat hard  Food Insecurity: Food Insecurity Present   Worried About [place] in the Last Year: Sometimes true   Ran Out of Food in the Last Year: Sometimes true  Transportation Needs: No Transportation Needs   Lack of Transportation (Medical): No   Lack of Transportation (Non-Medical): No  Physical Activity: Insufficiently Active   Days of Exercise per Week: 3 days   Minutes of Exercise per Session: 30 min  Stress: Stress Concern Present   Feeling of Stress : To some extent  Social Connections: Moderately Isolated   Frequency of Communication with Friends and Family: Three times a week   Frequency of Social Gatherings with Friends and Family: Once a week   Attends Religious Services: 1 to 4 times per year   Active Member of [employer] or Organizations: No   Attends Music therapist: Never   Marital Status: Separated  Intimate Partner Violence: Not At Risk   Fear of Current or Ex-Partner: No   Emotionally Abused: No   Physically Abused: No   Sexually Abused: No      ROS:  General: Negative for anorexia, unintentional weight loss, fever, chills, fatigue, weakness. Eyes: Negative for vision changes.  ENT: Negative  for hoarseness,  nasal congestion. See hpi CV: Negative for chest pain, angina, palpitations, dyspnea on exertion, peripheral edema.  Respiratory: Negative for dyspnea at rest,  cough, sputum, wheezing. See hpi GI: See history of present illness. GU:  Negative for dysuria, hematuria, urinary incontinence, urinary frequency, nocturnal urination.  MS: Negative for joint pain, low back pain.  Derm: Negative for rash or itching.  Neuro: Negative for weakness, abnormal sensation, seizure, frequent headaches, memory loss, confusion.  Psych: Negative for anxiety, depression, suicidal ideation, hallucinations.  Endo: Negative for unusual weight change.  Heme: Negative for bruising or bleeding. Allergy: Negative for rash or hives.   Observations/Objective:  Lab Results  Component Value Date   CREATININE 0.74 08/31/2021   BUN 23 (H) 08/31/2021   NA 138 08/31/2021   K 3.9 08/31/2021   CL 105 08/31/2021   CO2 26 08/31/2021   Lab Results  Component Value Date   ALT 20 08/31/2021   AST 16 08/31/2021   ALKPHOS 84 08/31/2021   BILITOT 0.6 08/31/2021   Lab Results  Component Value Date   WBC 11.4 (H) 08/31/2021   HGB 13.5 08/31/2021   HCT 41.5 08/31/2021   MCV 92.8 08/31/2021   PLT 408 (H) 08/31/2021    Assessment and Plan:  Chronic GERD/vague dysphagia: no prior EGD. States she was told she had an ulcer once. Bloating/belching better at this time. She is on omeprazole '40mg'$  daily and has been on this for years. Discussed screening for Barrett's given multiple risk factors (chronic GERD, age, race). I doubt she has esophageal stricture given vague symptoms but could consider esophageal dilation if appropriate.   Screening colonoscopy: no prior colonoscopy. No significant lower GI symptoms. Loose stools likely medication related.   Colonoscopy/EGD+/-ED with Dr. Abbey Chatters. ASA 3.  I have discussed the risks, alternatives, benefits with regards to but not limited to the risk of reaction to  medication, bleeding, infection, perforation and the patient is agreeable to proceed. Written consent to be obtained. Continue omeprazole '40mg'$  daily.   Follow Up Instructions:    I discussed the assessment and treatment plan with the patient. The patient was provided an opportunity  to ask questions and all were answered. The patient agreed with the plan and demonstrated an understanding of the instructions. AVS mailed to patient's home address.   The patient was advised to call back or seek an in-person evaluation if the symptoms worsen or if the condition fails to improve as anticipated.  I provided 10 minutes of virtual face-to-face time during this encounter.   Neil Crouch, PA-C

## 2021-09-14 ENCOUNTER — Other Ambulatory Visit: Payer: Self-pay

## 2021-09-14 ENCOUNTER — Encounter: Payer: Self-pay | Admitting: Gastroenterology

## 2021-09-14 ENCOUNTER — Telehealth (INDEPENDENT_AMBULATORY_CARE_PROVIDER_SITE_OTHER): Payer: BC Managed Care – PPO | Admitting: Gastroenterology

## 2021-09-14 ENCOUNTER — Telehealth: Payer: Self-pay | Admitting: *Deleted

## 2021-09-14 VITALS — Ht 64.0 in | Wt 170.0 lb

## 2021-09-14 DIAGNOSIS — K219 Gastro-esophageal reflux disease without esophagitis: Secondary | ICD-10-CM | POA: Diagnosis not present

## 2021-09-14 DIAGNOSIS — R1310 Dysphagia, unspecified: Secondary | ICD-10-CM | POA: Diagnosis not present

## 2021-09-14 DIAGNOSIS — Z1211 Encounter for screening for malignant neoplasm of colon: Secondary | ICD-10-CM

## 2021-09-14 NOTE — Telephone Encounter (Signed)
Franchot Gallo, you are scheduled for a virtual visit with your provider today.  Just as we do with appointments in the office, we must obtain your consent to participate.  Your consent will be active for this visit and any virtual visit you may have with one of our providers in the next 365 days.  If you have a MyChart account, I can also send a copy of this consent to you electronically.  All virtual visits are billed to your insurance company just like a traditional visit in the office.  As this is a virtual visit, video technology does not allow for your provider to perform a traditional examination.  This may limit your provider's ability to fully assess your condition.  If your provider identifies any concerns that need to be evaluated in person or the need to arrange testing such as labs, EKG, etc, we will make arrangements to do so.  Although advances in technology are sophisticated, we cannot ensure that it will always work on either your end or our end.  If the connection with a video visit is poor, we may have to switch to a telephone visit.  With either a video or telephone visit, we are not always able to ensure that we have a secure connection.   I need to obtain your verbal consent now.   Are you willing to proceed with your visit today?  ?

## 2021-09-14 NOTE — Patient Instructions (Signed)
Colonoscopy and upper endoscopy to be scheduled. See separate instructions.  ?Since you are in the midst of changing your diabetes medications, feel free to let me know of any changes between now and your procedures but in general you will need to hold ozempic for one week before your procedures (due to anesthesia issues), and reduce your other diabetes medications by 1/2 the day before your procedures and HOLD diabetes medications the morning of your procedures.  ?

## 2021-09-14 NOTE — Telephone Encounter (Signed)
Pt consented to a virtual visit. 

## 2021-09-15 ENCOUNTER — Telehealth: Payer: Self-pay

## 2021-09-15 ENCOUNTER — Other Ambulatory Visit: Payer: Self-pay

## 2021-09-15 MED ORDER — PEG 3350-KCL-NA BICARB-NACL 420 G PO SOLR: 4000.0000 mL | ORAL | 0 refills | Status: DC

## 2021-09-15 NOTE — Telephone Encounter (Signed)
Pre-op appt 09/28/21. Appt letter mailed with procedure instructions. ?

## 2021-09-15 NOTE — Telephone Encounter (Signed)
Called pt, TCS/EGD/-/+DIL w/Propofol ASA 3 w/Dr. Abbey Chatters scheduled for 10/03/21 at 8:45am. Rx for prep sent to pharmacy. Orders entered. ?

## 2021-09-20 ENCOUNTER — Ambulatory Visit (INDEPENDENT_AMBULATORY_CARE_PROVIDER_SITE_OTHER): Payer: BC Managed Care – PPO | Admitting: Women's Health

## 2021-09-20 ENCOUNTER — Encounter: Payer: Self-pay | Admitting: Women's Health

## 2021-09-20 ENCOUNTER — Other Ambulatory Visit: Payer: Self-pay

## 2021-09-20 VITALS — BP 118/75 | HR 82 | Ht 64.0 in | Wt 168.8 lb

## 2021-09-20 DIAGNOSIS — A630 Anogenital (venereal) warts: Secondary | ICD-10-CM | POA: Diagnosis not present

## 2021-09-20 NOTE — Progress Notes (Signed)
? ?  GYN VISIT ?Patient name: Alyssa Morales MRN [id number]  Date of birth: [birth year] ?Chief Complaint:   ?TCA treatment ? ?History of Present Illness:   ?Alyssa Morales is a 51 y.o. 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 Pt still feels treatments are helping and wants to continue.     ?Patient's last menstrual period was 10/01/2016. ?The current method of family planning is post menopausal status.  ?Last pap 07/19/21. Results were: NILM w/ HRHPV negative ? ?Depression screen [facility] 2/9 07/19/2021  ?Decreased Interest 0  ?Down, Depressed, Hopeless 1  ?PHQ - 2 Score 1  ?Altered sleeping 2  ?Tired, decreased energy 0  ?Change in appetite 2  ?Feeling bad or failure about yourself  0  ?Trouble concentrating 2  ?Moving slowly or fidgety/restless 0  ?Suicidal thoughts 0  ?PHQ-9 Score 7  ? ?  ?GAD 7 : Generalized Anxiety Score 07/19/2021  ?Nervous, Anxious, on Edge 1  ?Control/stop worrying 2  ?Worry too much - different things 2  ?Trouble relaxing 1  ?Restless 2  ?Easily annoyed or irritable 2  ?Afraid - awful might happen 2  ?Total GAD 7 Score 12  ? ? ? ?Review of Systems:   ?Pertinent items are noted in HPI ?Denies fever/chills, dizziness, headaches, visual disturbances, fatigue, shortness of breath, chest pain, abdominal pain, vomiting, abnormal vaginal discharge/itching/odor/irritation, problems with periods, bowel movements, urination, or intercourse unless otherwise stated above.  ?Pertinent History Reviewed:  ?Reviewed past medical,surgical, social, obstetrical and family history.  ?Reviewed problem list, medications and allergies. ?Physical Assessment:  ? ?Vitals:  ? 09/20/21 1429  ?BP: 118/75  ?Pulse: 82  ?Weight: 168 lb 12.8 oz (76.6 kg)  ?Height: '5\' 4"'$  (1.626 m)  ?Body mass index is 28.97 kg/m?. ? ?     Physical Examination:  ? General appearance: alert, well appearing, and in no distress ? Mental status: alert, oriented to person, place, and time ? Skin: warm & dry   ? Cardiovascular: normal heart rate noted ? Respiratory: normal respiratory effort, no distress ? Abdomen: soft, non-tender  ? Pelvic: tiny ~0.78m bump Lt vulva, treated w/ TCA, offered trying to excise, declines ? Extremities: no edema  ? ?Chaperone: AGlenard HaringNeas   ? ?No results found for this or any previous visit (from the past 24 hour(s)).  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 ?Meds: No orders of the defined types were placed in this encounter. ? ? ?No orders of the defined types were placed in this encounter. ? ? ?Return in about 1 week (around 09/27/2021) for TCA treatment. ? ?KRoma SchanzCNM, WHNP-BC ?09/20/2021 ?2:55 PM  ?

## 2021-09-26 NOTE — Patient Instructions (Signed)
? ? ? ? ? ? ? ? ? ? Alyssa Morales ? 09/26/2021  ?  ? '@PREFPERIOPPHARMACY'$ @ ? ? Your procedure is scheduled on  10/03/2021. ? ? Report to [facility] at  0700 A.M. ? ? Call this number if you have problems the morning of surgery: ? [phone] ? ? Remember: ? Follow the diet and prep instructions given to you by the office. ? ?DO NOT take any medications for diabetes the morning of your procedure. ?  ? ?Use your inhaler before you come and bring your rescue inhaler with you. ? ? ? Take these medicines the morning of surgery with A SIP OF WATER  ? ?wellbutrin, atarax, omeprazole. ? ?  ? Do not wear jewelry, make-up or nail polish. ? Do not wear lotions, powders, or perfumes, or deodorant. ? Do not shave 48 hours prior to surgery.  Men may shave face and neck. ? Do not bring valuables to the hospital. ? [facility] is not responsible for any belongings or valuables. ? ?Contacts, dentures or bridgework may not be worn into surgery.  Leave your suitcase in the car.  After surgery it may be brought to your room. ? ?For patients admitted to the hospital, discharge time will be determined by your treatment team. ? ?Patients discharged the day of surgery will not be allowed to drive home and must have someone with them for 24 hours.  ? ? ?Special instructions:   DO NOT smoke tobacco or vape for 24 hours before your procedure. ? ?Please read over the following fact sheets that you were given. ?Anesthesia Post-op Instructions and Care and Recovery After Surgery ?  ? ? ? Upper Endoscopy, Adult, Care After ?This sheet gives you information about how to care for yourself after your procedure. Your health care provider may also give you more specific instructions. If you have problems or questions, contact your health care provider. ?What can I expect after the procedure? ?After the procedure, it is common to have: ?A sore throat. ?Mild stomach pain or discomfort. ?Bloating. ?Nausea. ?Follow these instructions at home: ? ?Follow  instructions from your health care provider about what to eat or drink after your procedure. ?Return to your normal activities as told by your health care provider. Ask your health care provider what activities are safe for you. ?Take over-the-counter and prescription medicines only as told by your health care provider. ?If you were given a sedative during the procedure, it can affect you for several hours. Do not drive or operate machinery until your health care provider says that it is safe. ?Keep all follow-up visits as told by your health care provider. This is important. ?Contact a health care provider if you have: ?A sore throat that lasts longer than one day. ?Trouble swallowing. ?Get help right away if: ?You vomit blood or your vomit looks like coffee grounds. ?You have: ?A fever. ?Bloody, black, or tarry stools. ?A severe sore throat or you cannot swallow. ?Difficulty breathing. ?Severe pain in your chest or abdomen. ?Summary ?After the procedure, it is common to have a sore throat, mild stomach discomfort, bloating, and nausea. ?If you were given a sedative during the procedure, it can affect you for several hours. Do not drive or operate machinery until your health care provider says that it is safe. ?Follow instructions from your health care provider about what to eat or drink after your procedure. ?Return to your normal activities as told by your health care provider. ?This information is  not intended to replace advice given to you by your health care provider. Make sure you discuss any questions you have with your health care provider. ?Document Revised: 05/02/2019 Document Reviewed: 11/26/2017 ?Elsevier Patient Education ? [place]. ?Esophageal Dilatation ?Esophageal dilatation, also called esophageal dilation, is a procedure to widen or open a blocked or narrowed part of the esophagus. The esophagus is the part of the body that moves food and liquid from the mouth to the stomach. You may  need this procedure if: ?You have a buildup of scar tissue in your esophagus that makes it difficult, painful, or impossible to swallow. This can be caused by gastroesophageal reflux disease (GERD). ?You have cancer of the esophagus. ?There is a problem with how food moves through your esophagus. ?In some cases, you may need this procedure repeated at a later time to dilate the esophagus gradually. ?Tell a health care provider about: ?Any allergies you have. ?All medicines you are taking, including vitamins, herbs, eye drops, creams, and over-the-counter medicines. ?Any problems you or family members have had with anesthetic medicines. ?Any blood disorders you have. ?Any surgeries you have had. ?Any medical conditions you have. ?Any antibiotic medicines you are required to take before dental procedures. ?Whether you are pregnant or may be pregnant. ?What are the risks? ?Generally, this is a safe procedure. However, problems may occur, including: ?Bleeding due to a tear in the lining of the esophagus. ?A hole, or perforation, in the esophagus. ?What happens before the procedure? ?Ask your health care provider about: ?Changing or stopping your regular medicines. This is especially important if you are taking diabetes medicines or blood thinners. ?Taking medicines such as aspirin and ibuprofen. These medicines can thin your blood. Do not take these medicines unless your health care provider tells you to take them. ?Taking over-the-counter medicines, vitamins, herbs, and supplements. ?Follow instructions from your health care provider about eating or drinking restrictions. ?Plan to have a responsible adult take you home from the hospital or clinic. ?Plan to have a responsible adult care for you for the time you are told after you leave the hospital or clinic. This is important. ?What happens during the procedure? ?You may be given a medicine to help you relax (sedative). ?A numbing medicine may be sprayed into the back  of your throat, or you may gargle the medicine. ?Your health care provider may perform the dilatation using various surgical instruments, such as: ?Simple dilators. This instrument is carefully placed in the esophagus to stretch it. ?Guided wire bougies. This involves using an endoscope to insert a wire into the esophagus. A dilator is passed over this wire to enlarge the esophagus. Then the wire is removed. ?Balloon dilators. An endoscope with a small balloon is inserted into the esophagus. The balloon is inflated to stretch the esophagus and open it up. ?The procedure may vary among health care providers and hospitals. ?What can I expect after the procedure? ?Your blood pressure, heart rate, breathing rate, and blood oxygen level will be monitored until you leave the hospital or clinic. ?Your throat may feel slightly sore and numb. This will get better over time. ?You will not be allowed to eat or drink until your throat is no longer numb. ?When you are able to drink, urinate, and sit on the edge of the bed without nausea or dizziness, you may be able to return home. ?Follow these instructions at home: ?Take over-the-counter and prescription medicines only as told by your health care provider. ?If  you were given a sedative during the procedure, it can affect you for several hours. Do not drive or operate machinery until your health care provider says that it is safe. ?Plan to have a responsible adult care for you for the time you are told. This is important. ?Follow instructions from your health care provider about any eating or drinking restrictions. ?Do not use any products that contain nicotine or tobacco, such as cigarettes, e-cigarettes, and chewing tobacco. If you need help quitting, ask your health care provider. ?Keep all follow-up visits. This is important. ?Contact a health care provider if: ?You have a fever. ?You have pain that is not relieved by medicine. ?Get help right away if: ?You have chest  pain. ?You have trouble breathing. ?You have trouble swallowing. ?You vomit blood. ?You have black, tarry, or bloody stools. ?These symptoms may represent a serious problem that is an emergency. Do not wait to

## 2021-09-27 ENCOUNTER — Ambulatory Visit: Payer: BC Managed Care – PPO | Admitting: Women's Health

## 2021-09-27 ENCOUNTER — Encounter: Payer: Self-pay | Admitting: Women's Health

## 2021-09-27 ENCOUNTER — Other Ambulatory Visit: Payer: Self-pay

## 2021-09-27 ENCOUNTER — Other Ambulatory Visit (HOSPITAL_COMMUNITY)
Admission: RE | Admit: 2021-09-27 | Discharge: 2021-09-27 | Disposition: A | Discharge status: Home / Self Care | Payer: BC Managed Care – PPO | Source: Ambulatory Visit | Attending: Women's Health | Admitting: Women's Health

## 2021-09-27 VITALS — BP 130/87 | HR 77 | Ht 64.0 in | Wt 164.5 lb

## 2021-09-27 DIAGNOSIS — A5131 Condyloma latum: Secondary | ICD-10-CM | POA: Diagnosis present

## 2021-09-27 NOTE — Progress Notes (Signed)
? ?  GYN VISIT ?Patient name: Alyssa Morales MRN [id number]  Date of birth: 03/11/1971 ?Chief Complaint:   ?TCA treatment ? ?History of Present Illness:   ?Alyssa Morales is a 51 y.o. 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 Today just 'wants it gone', is ok w/ excision. Informed consent signed and on chart.     ?Patient's last menstrual period was 10/01/2016. ?The current method of family planning is post menopausal status.  ?Last pap 07/19/21. Results were: NILM w/ HRHPV negative ? ?Depression screen [facility] 2/9 07/19/2021  ?Decreased Interest 0  ?Down, Depressed, Hopeless 1  ?PHQ - 2 Score 1  ?Altered sleeping 2  ?Tired, decreased energy 0  ?Change in appetite 2  ?Feeling bad or failure about yourself  0  ?Trouble concentrating 2  ?Moving slowly or fidgety/restless 0  ?Suicidal thoughts 0  ?PHQ-9 Score 7  ? ?  ?GAD 7 : Generalized Anxiety Score 07/19/2021  ?Nervous, Anxious, on Edge 1  ?Control/stop worrying 2  ?Worry too much - different things 2  ?Trouble relaxing 1  ?Restless 2  ?Easily annoyed or irritable 2  ?Afraid - awful might happen 2  ?Total GAD 7 Score 12  ? ? ? ?Review of Systems:   ?Pertinent items are noted in HPI ?Denies fever/chills, dizziness, headaches, visual disturbances, fatigue, shortness of breath, chest pain, abdominal pain, vomiting, abnormal vaginal discharge/itching/odor/irritation, problems with periods, bowel movements, urination, or intercourse unless otherwise stated above.  ?Pertinent History Reviewed:  ?Reviewed past medical,surgical, social, obstetrical and family history.  ?Reviewed problem list, medications and allergies. ?Physical Assessment:  ? ?Vitals:  ? 09/27/21 1403  ?BP: 130/87  ?Pulse: 77  ?Weight: 164 lb 8 oz (74.6 kg)  ?Height: '5\' 4"'$  (1.626 m)  ?Body mass index is 28.24 kg/m?. ? ?     Physical Examination:  ? General appearance: alert, well appearing, and in no  distress ? Mental status: alert, oriented to person, place, and time ? Skin: warm & dry  ? Cardiovascular: normal heart rate noted ? Respiratory: normal respiratory effort, no distress ? Abdomen: soft, non-tender  ? Pelvic: Lt vulva w/ white fingerlike projection (was more like a flesh colored bump last week), 2 small areas of erythema/irritation to Lt of lesion. Sprayed lesion w/ Hurricaine spray, then injected w/ 0.5cc lidocaine, unable to clamp base prior to excision as area was so small, so held lesion w/ hemostats and excised at base w/ 11 blade. Hemostasis achieved w/ pressure. Will send lesion for pathology ? Extremities: no edema  ? ?Chaperone: Levy Pupa   ? ?No results found for this or any previous visit (from the past 24 hour(s)).  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 Keep scheduled appt for next week in case 2 areas of irritation are not improved. Suspect these are from the [facility]. If improved/resolved, can call and cancel appt.  ? ?Meds: No orders of the defined types were placed in this encounter. ? ? ?No orders of the defined types were placed in this encounter. ? ? ?Return for As scheduled. ? ?Roma Schanz CNM, WHNP-BC ?09/27/2021 ?3:00 PM  ?

## 2021-09-27 NOTE — Addendum Note (Signed)
Addended by: Linton Rump on: 09/27/2021 04:38 PM ? ? Modules accepted: Orders ? ?

## 2021-09-28 ENCOUNTER — Encounter (HOSPITAL_COMMUNITY): Payer: Self-pay

## 2021-09-28 ENCOUNTER — Other Ambulatory Visit: Payer: Self-pay

## 2021-09-28 ENCOUNTER — Encounter (HOSPITAL_COMMUNITY)
Admission: RE | Admit: 2021-09-28 | Discharge: 2021-09-28 | Disposition: A | Discharge status: Home / Self Care | Payer: BC Managed Care – PPO | Source: Ambulatory Visit | Attending: Internal Medicine | Admitting: Internal Medicine

## 2021-09-29 LAB — SURGICAL PATHOLOGY

## 2021-10-03 ENCOUNTER — Encounter (HOSPITAL_COMMUNITY): Payer: Self-pay

## 2021-10-03 ENCOUNTER — Ambulatory Visit (HOSPITAL_COMMUNITY): Payer: BC Managed Care – PPO | Admitting: Anesthesiology

## 2021-10-03 ENCOUNTER — Encounter (HOSPITAL_COMMUNITY)
Admission: RE | Disposition: A | Discharge status: Home / Self Care | Payer: Self-pay | Source: Home / Self Care | Attending: Internal Medicine

## 2021-10-03 ENCOUNTER — Other Ambulatory Visit: Payer: Self-pay

## 2021-10-03 ENCOUNTER — Ambulatory Visit (HOSPITAL_COMMUNITY)
Admission: RE | Admit: 2021-10-03 | Discharge: 2021-10-03 | Disposition: A | Discharge status: Home / Self Care | Payer: BC Managed Care – PPO | Attending: Internal Medicine | Admitting: Internal Medicine

## 2021-10-03 DIAGNOSIS — Z1211 Encounter for screening for malignant neoplasm of colon: Secondary | ICD-10-CM

## 2021-10-03 DIAGNOSIS — D123 Benign neoplasm of transverse colon: Secondary | ICD-10-CM | POA: Insufficient documentation

## 2021-10-03 DIAGNOSIS — K2970 Gastritis, unspecified, without bleeding: Secondary | ICD-10-CM | POA: Diagnosis not present

## 2021-10-03 DIAGNOSIS — F17210 Nicotine dependence, cigarettes, uncomplicated: Secondary | ICD-10-CM | POA: Insufficient documentation

## 2021-10-03 DIAGNOSIS — I2510 Atherosclerotic heart disease of native coronary artery without angina pectoris: Secondary | ICD-10-CM | POA: Insufficient documentation

## 2021-10-03 DIAGNOSIS — K648 Other hemorrhoids: Secondary | ICD-10-CM | POA: Insufficient documentation

## 2021-10-03 DIAGNOSIS — I10 Essential (primary) hypertension: Secondary | ICD-10-CM | POA: Insufficient documentation

## 2021-10-03 DIAGNOSIS — K2270 Barrett's esophagus without dysplasia: Secondary | ICD-10-CM | POA: Diagnosis not present

## 2021-10-03 DIAGNOSIS — Z13810 Encounter for screening for upper gastrointestinal disorder: Secondary | ICD-10-CM | POA: Diagnosis not present

## 2021-10-03 DIAGNOSIS — Z794 Long term (current) use of insulin: Secondary | ICD-10-CM | POA: Insufficient documentation

## 2021-10-03 DIAGNOSIS — Z8371 Family history of colonic polyps: Secondary | ICD-10-CM | POA: Diagnosis not present

## 2021-10-03 DIAGNOSIS — K2289 Other specified disease of esophagus: Secondary | ICD-10-CM | POA: Insufficient documentation

## 2021-10-03 DIAGNOSIS — Z79899 Other long term (current) drug therapy: Secondary | ICD-10-CM | POA: Insufficient documentation

## 2021-10-03 DIAGNOSIS — K319 Disease of stomach and duodenum, unspecified: Secondary | ICD-10-CM | POA: Diagnosis not present

## 2021-10-03 DIAGNOSIS — D125 Benign neoplasm of sigmoid colon: Secondary | ICD-10-CM

## 2021-10-03 DIAGNOSIS — K5730 Diverticulosis of large intestine without perforation or abscess without bleeding: Secondary | ICD-10-CM | POA: Diagnosis not present

## 2021-10-03 DIAGNOSIS — E119 Type 2 diabetes mellitus without complications: Secondary | ICD-10-CM | POA: Diagnosis not present

## 2021-10-03 DIAGNOSIS — K449 Diaphragmatic hernia without obstruction or gangrene: Secondary | ICD-10-CM | POA: Diagnosis not present

## 2021-10-03 DIAGNOSIS — K219 Gastro-esophageal reflux disease without esophagitis: Secondary | ICD-10-CM | POA: Insufficient documentation

## 2021-10-03 DIAGNOSIS — I252 Old myocardial infarction: Secondary | ICD-10-CM | POA: Insufficient documentation

## 2021-10-03 DIAGNOSIS — Z7984 Long term (current) use of oral hypoglycemic drugs: Secondary | ICD-10-CM | POA: Insufficient documentation

## 2021-10-03 HISTORY — PX: COLONOSCOPY WITH PROPOFOL: SHX5780

## 2021-10-03 HISTORY — PX: BIOPSY: SHX5522

## 2021-10-03 HISTORY — PX: POLYPECTOMY: SHX149

## 2021-10-03 HISTORY — PX: ESOPHAGOGASTRODUODENOSCOPY (EGD) WITH PROPOFOL: SHX5813

## 2021-10-03 LAB — GLUCOSE, CAPILLARY: Glucose-Capillary: 127 mg/dL — ABNORMAL HIGH (ref 70–99)

## 2021-10-03 SURGERY — COLONOSCOPY WITH PROPOFOL
Anesthesia: General

## 2021-10-03 MED ORDER — PHENYLEPHRINE 40 MCG/ML (10ML) SYRINGE FOR IV PUSH (FOR BLOOD PRESSURE SUPPORT)
PREFILLED_SYRINGE | INTRAVENOUS | Status: DC | PRN
  Administered 2021-10-03: 120 ug via INTRAVENOUS
  Administered 2021-10-03: 80 ug via INTRAVENOUS

## 2021-10-03 MED ORDER — PROPOFOL 500 MG/50ML IV EMUL
INTRAVENOUS | Status: DC | PRN
  Administered 2021-10-03: 200 ug/kg/min via INTRAVENOUS

## 2021-10-03 MED ORDER — PROPOFOL 10 MG/ML IV BOLUS
INTRAVENOUS | Status: DC | PRN
  Administered 2021-10-03 (×2): 10 mg via INTRAVENOUS
  Administered 2021-10-03: 80 mg via INTRAVENOUS

## 2021-10-03 MED ORDER — LACTATED RINGERS IV SOLN: INTRAVENOUS | Status: DC

## 2021-10-03 MED ORDER — LIDOCAINE 2% (20 MG/ML) 5 ML SYRINGE
INTRAMUSCULAR | Status: DC | PRN
  Administered 2021-10-03: 50 mg via INTRAVENOUS

## 2021-10-03 NOTE — Transfer of Care (Signed)
Immediate Anesthesia Transfer of Care Note ? ?Patient: Alyssa Morales ? ?Procedure(s) Performed: COLONOSCOPY WITH PROPOFOL ?ESOPHAGOGASTRODUODENOSCOPY (EGD) WITH PROPOFOL ?BIOPSY ?POLYPECTOMY INTESTINAL ? ?Patient Location: Short Stay ? ?Anesthesia Type:MAC ? ?Level of Consciousness: awake, alert , oriented and patient cooperative ? ?Airway & Oxygen Therapy: Patient Spontanous Breathing ? ?Post-op Assessment: Report given to RN, Post -op Vital signs reviewed and stable and Patient moving all extremities ? ?Post vital signs: Reviewed and stable ? ?Last Vitals:  ?Vitals Value Taken Time  ?BP    ?Temp    ?Pulse    ?Resp    ?SpO2    ? ? ?Last Pain:  ?Vitals:  ? 10/03/21 0846  ?TempSrc:   ?PainSc: 0-No pain  ?   ? ?  ? ?Complications: No notable events documented. ?

## 2021-10-03 NOTE — Interval H&P Note (Signed)
History and Physical Interval Note: ? ?10/03/2021 ?8:36 AM ? ?Alyssa Morales  has presented today for surgery, with the diagnosis of screening colonoscopy, chronic GERD, dysphagia.  The various methods of treatment have been discussed with the patient and family. After consideration of risks, benefits and other options for treatment, the patient has consented to  Procedure(s) with comments: ?COLONOSCOPY WITH PROPOFOL (N/A) - 8:45am ?ESOPHAGOGASTRODUODENOSCOPY (EGD) WITH PROPOFOL (N/A) ?BALLOON DILATION (N/A) as a surgical intervention.  The patient's history has been reviewed, patient examined, no change in status, stable for surgery.  I have reviewed the patient's chart and labs.  Questions were answered to the patient's satisfaction.   ? ? ?Eloise Harman ? ? ?

## 2021-10-03 NOTE — Discharge Instructions (Addendum)
EGD ?Discharge instructions ?Please read the instructions outlined below and refer to this sheet in the next few weeks. These discharge instructions provide you with general information on caring for yourself after you leave the hospital. Your doctor may also give you specific instructions. While your treatment has been planned according to the most current medical practices available, unavoidable complications occasionally occur. If you have any problems or questions after discharge, please call your doctor. ?ACTIVITY ?You may resume your regular activity but move at a slower pace for the next 24 hours.  ?Take frequent rest periods for the next 24 hours.  ?Walking will help expel (get rid of) the air and reduce the bloated feeling in your abdomen.  ?No driving for 24 hours (because of the anesthesia (medicine) used during the test).  ?You may shower.  ?Do not sign any important legal documents or operate any machinery for 24 hours (because of the anesthesia used during the test).  ?NUTRITION ?Drink plenty of fluids.  ?You may resume your normal diet.  ?Begin with a light meal and progress to your normal diet.  ?Avoid alcoholic beverages for 24 hours or as instructed by your caregiver.  ?MEDICATIONS ?You may resume your normal medications unless your caregiver tells you otherwise.  ?WHAT YOU CAN EXPECT TODAY ?You may experience abdominal discomfort such as a feeling of fullness or ?gas? pains.  ?FOLLOW-UP ?Your doctor will discuss the results of your test with you.  ?SEEK IMMEDIATE MEDICAL ATTENTION IF ANY OF THE FOLLOWING OCCUR: ?Excessive nausea (feeling sick to your stomach) and/or vomiting.  ?Severe abdominal pain and distention (swelling).  ?Trouble swallowing.  ?Temperature over 101? F (37.8? C).  ?Rectal bleeding or vomiting of blood.  ? ? ?Colonoscopy ?Discharge Instructions ? ?Read the instructions outlined below and refer to this sheet in the next few weeks. These discharge instructions provide you with  general information on caring for yourself after you leave the hospital. Your doctor may also give you specific instructions. While your treatment has been planned according to the most current medical practices available, unavoidable complications occasionally occur.  ? ?ACTIVITY ?You may resume your regular activity, but move at a slower pace for the next 24 hours.  ?Take frequent rest periods for the next 24 hours.  ?Walking will help get rid of the air and reduce the bloated feeling in your belly (abdomen).  ?No driving for 24 hours (because of the medicine (anesthesia) used during the test).   ?Do not sign any important legal documents or operate any machinery for 24 hours (because of the anesthesia used during the test).  ?NUTRITION ?Drink plenty of fluids.  ?You may resume your normal diet as instructed by your doctor.  ?Begin with a light meal and progress to your normal diet. Heavy or fried foods are harder to digest and may make you feel sick to your stomach (nauseated).  ?Avoid alcoholic beverages for 24 hours or as instructed.  ?MEDICATIONS ?You may resume your normal medications unless your doctor tells you otherwise.  ?WHAT YOU CAN EXPECT TODAY ?Some feelings of bloating in the abdomen.  ?Passage of more gas than usual.  ?Spotting of blood in your stool or on the toilet paper.  ?IF YOU HAD POLYPS REMOVED DURING THE COLONOSCOPY: ?No aspirin products for 7 days or as instructed.  ?No alcohol for 7 days or as instructed.  ?Eat a soft diet for the next 24 hours.  ?FINDING OUT THE RESULTS OF YOUR TEST ?Not all test results are available  during your visit. If your test results are not back during the visit, make an appointment with your caregiver to find out the results. Do not assume everything is normal if you have not heard from your caregiver or the medical facility. It is important for you to follow up on all of your test results.  ?SEEK IMMEDIATE MEDICAL ATTENTION IF: ?You have more than a spotting of  blood in your stool.  ?Your belly is swollen (abdominal distention).  ?You are nauseated or vomiting.  ?You have a temperature over 101.  ?You have abdominal pain or discomfort that is severe or gets worse throughout the day.  ? ?Your EGD revealed mild amount inflammation in your stomach.  I took biopsies of this to rule out infection with a bacteria called H. pylori.  Also appears that you have Barrett's esophagus as well.  I also biopsied this area.  Await pathology results, my office will contact you.  We will likely need repeat EGD in 3 years.  Continue on omeprazole daily.  You have a small hiatal hernia which is likely causing your difficulty swallowing at times.  No obvious stricture requiring dilation today. ? ?Your colonoscopy revealed 2 polyp(s) which I removed successfully. Await pathology results, my office will contact you. I recommend repeating colonoscopy in 5 years for surveillance purposes. You also have diverticulosis and internal hemorrhoids. I would recommend increasing fiber in your diet or adding OTC Benefiber/Metamucil. Be sure to drink at least 4 to 6 glasses of water daily.  ? ?Follow-up with GI 6 months ? ? ? ? ?I hope you have a great rest of your week! ? ?Elon Alas. Abbey Chatters, D.O. ?Gastroenterology and Hepatology ?[facility] ? ? ?

## 2021-10-03 NOTE — Op Note (Signed)
[facility] ?Patient Name: Alyssa Morales ?Procedure Date: 10/03/2021 9:01 AM ?MRN: [id number] ?Date of Birth: [birth year] ?Attending MD: Elon Alas. Abbey Chatters , DO ?CSN: [id number] ?Age: 51 ?Admit Type: Outpatient ?Procedure:                Colonoscopy ?Indications:              Screening for colorectal malignant neoplasm ?Providers:                Elon Alas. Abbey Chatters, DO, Bufalo Page, [place]                          Risa Grill, Technician ?Referring MD:              ?Medicines:                See the Anesthesia note for documentation of the  ?                          administered medications ?Complications:            No immediate complications. ?Estimated Blood Loss:     Estimated blood loss was minimal. ?Procedure:                Pre-Anesthesia Assessment: ?                          - The anesthesia plan was to use monitored  ?                          anesthesia care (MAC). ?                          After obtaining informed consent, the colonoscope  ?                          was passed under direct vision. Throughout the  ?                          procedure, the patient's blood pressure, pulse, and  ?                          oxygen saturations were monitored continuously. The  ?                          PCF-HQ190L ([id number]) scope was introduced through  ?                          the anus and advanced to the the cecum, identified  ?                          by appendiceal orifice and ileocecal valve. The  ?                          colonoscopy was performed without difficulty. The  ?                          patient tolerated the procedure well.  The quality  ?                          of the bowel preparation was evaluated using the  ?                          BBPS [facility] Bowel Preparation Scale) with scores  ?                          of: Right Colon = 2 (minor amount of residual  ?                          staining, small fragments of stool and/or opaque  ?                          liquid, but  mucosa seen well), Transverse Colon = 2  ?                          (minor amount of residual staining, small fragments  ?                          of stool and/or opaque liquid, but mucosa seen  ?                          well) and Left Colon = 2 (minor amount of residual  ?                          staining, small fragments of stool and/or opaque  ?                          liquid, but mucosa seen well). The total BBPS score  ?                          equals 6. The quality of the bowel preparation was  ?                          fair. ?Scope In: 9:03:19 AM ?Scope Out: 9:23:09 AM ?Scope Withdrawal Time: 0 hours 17 minutes 1 second  ?Total Procedure Duration: 0 hours 19 minutes 50 seconds  ?Findings: ?     The perianal and digital rectal examinations were normal. ?     Non-bleeding internal hemorrhoids were found during endoscopy. ?     Multiple small and large-mouthed diverticula were found in the sigmoid  ?     colon and descending colon. ?     A 2 mm polyp was found in the transverse colon. The polyp was sessile.  ?     The polyp was removed with a cold biopsy forceps. Resection and  ?     retrieval were complete. ?     A 8 mm polyp was found in the sigmoid colon. The polyp was sessile. The  ?     polyp was removed with a cold snare. Resection and retrieval were  ?     complete. ?     The exam was otherwise without abnormality. ?Impression:               -  Preparation of the colon was fair. ?                          - Non-bleeding internal hemorrhoids. ?                          - Diverticulosis in the sigmoid colon and in the  ?                          descending colon. ?                          - One 2 mm polyp in the transverse colon, removed  ?                          with a cold biopsy forceps. Resected and retrieved. ?                          - One 8 mm polyp in the sigmoid colon, removed with  ?                          a cold snare. Resected and retrieved. ?                          - The  examination was otherwise normal. ?Moderate Sedation: ?     Per Anesthesia Care ?Recommendation:           - Patient has a contact number available for  ?                          emergencies. The signs and symptoms of potential  ?                          delayed complications were discussed with the  ?                          patient. Return to normal activities tomorrow.  ?                          Written discharge instructions were provided to the  ?                          patient. ?                          - Resume previous diet. ?                          - Continue present medications. ?                          - Await pathology results. ?                          - Repeat colonoscopy in 5 years for surveillance. ?                          -  Return to GI clinic in 6 months. ?Procedure Code(s):        --- Professional --- ?                          [phone], Colonoscopy, flexible; with removal of  ?                          tumor(s), polyp(s), or other lesion(s) by snare  ?                          technique ?                          45380, 59, Colonoscopy, flexible; with biopsy,  ?                          single or multiple ?Diagnosis Code(s):        --- Professional --- ?                          Z12.11, Encounter for screening for malignant  ?                          neoplasm of colon ?                          K64.8, Other hemorrhoids ?                          K63.5, Polyp of colon ?                          K57.30, Diverticulosis of large intestine without  ?                          perforation or abscess without bleeding ?CPT copyright 2019 American Medical Association. All rights reserved. ?The codes documented in this report are preliminary and upon coder review may  ?be revised to meet current compliance requirements. ?Elon Alas. Abbey Chatters, DO ?Elon Alas. [place], DO ?10/03/2021 9:25:12 AM ?This report has been signed electronically. ?Number of Addenda: 0 ?

## 2021-10-03 NOTE — Anesthesia Postprocedure Evaluation (Signed)
Anesthesia Post Note ? ?Patient: Alyssa Morales ? ?Procedure(s) Performed: COLONOSCOPY WITH PROPOFOL ?ESOPHAGOGASTRODUODENOSCOPY (EGD) WITH PROPOFOL ?BIOPSY ?POLYPECTOMY INTESTINAL ? ?Patient location during evaluation: Phase II ?Anesthesia Type: General ?Level of consciousness: awake ?Pain management: pain level controlled ?Vital Signs Assessment: post-procedure vital signs reviewed and stable ?Respiratory status: spontaneous breathing and respiratory function stable ?Cardiovascular status: blood pressure returned to baseline and stable ?Postop Assessment: no headache and no apparent nausea or vomiting ?Anesthetic complications: no ?Comments: Late entry ? ? ?No notable events documented. ? ? ?Last Vitals:  ?Vitals:  ? 10/03/21 0846 10/03/21 0931  ?BP: (!) 88/52 96/62  ?Pulse:  71  ?Resp:  18  ?Temp:    ?SpO2: 96% 96%  ?  ?Last Pain:  ?Vitals:  ? 10/03/21 0931  ?TempSrc:   ?PainSc: 0-No pain  ? ? ?  ?  ?  ?  ?  ?  ? ?Louann Sjogren ? ? ? ? ?

## 2021-10-03 NOTE — Op Note (Signed)
[facility] [address] ?Patient Name: Alyssa Morales ?Procedure Date: 10/03/2021 8:38 AM ?MRN: [id number] ?Date of Birth: December 30, 1970 ?Attending MD: Elon Alas. Abbey Chatters , DO ?CSN: [id number] ?Age: 51 ?Admit Type: Outpatient ?Procedure:                Upper GI endoscopy ?Indications:              Screening procedure, Screening for Barrett's  ?                          esophagus ?Providers:                Elon Alas. Abbey Chatters, DO, Gregory Page, [place]                          Risa Grill, Technician ?Referring MD:              ?Medicines:                See the Anesthesia note for documentation of the  ?                          administered medications ?Complications:            No immediate complications. ?Estimated Blood Loss:     Estimated blood loss was minimal. ?Procedure:                Pre-Anesthesia Assessment: ?                          - The anesthesia plan was to use monitored  ?                          anesthesia care (MAC). ?                          After obtaining informed consent, the endoscope was  ?                          passed under direct vision. Throughout the  ?                          procedure, the patient's blood pressure, pulse, and  ?                          oxygen saturations were monitored continuously. The  ?                          GIF-H190 ([id number]) scope was introduced through the  ?                          mouth, and advanced to the second part of duodenum.  ?                          The upper GI endoscopy was accomplished without  ?                          difficulty. The patient tolerated the procedure  ?  well. ?Scope In: 8:50:00 AM ?Scope Out: 8:58:41 AM ?Total Procedure Duration: 0 hours 8 minutes 41 seconds  ?Findings: ?     The esophagus and gastroesophageal junction were examined with white  ?     light and narrow band imaging (NBI) from a forward view and retroflexed  ?     position. There were esophageal mucosal changes consistent with  ?      long-segment Barrett's esophagus. These changes involved the mucosa at  ?     the upper extent of the gastric folds (35 cm from the incisors)  ?     extending to the Z-line (31 cm from the incisors). Circumferential  ?     salmon-colored mucosa was present. The maximum longitudinal extent of  ?     these esophageal mucosal changes was 4 cm in length. Mucosa was biopsied  ?     every 1 cm per [place] protocol with a cold forceps for histology. A  ?     total of 4 specimen bottles were sent to pathology. ?     Localized moderate inflammation characterized by erythema was found in  ?     the gastric body. Biopsies were taken with a cold forceps for  ?     Helicobacter pylori testing. ?     The duodenal bulb, first portion of the duodenum and second portion of  ?     the duodenum were normal. ?     A 2 cm hiatal hernia was present. ?Impression:               - Esophageal mucosal changes consistent with  ?                          long-segment Barrett's esophagus. Biopsied. ?                          - Gastritis. Biopsied. ?                          - Normal duodenal bulb, first portion of the  ?                          duodenum and second portion of the duodenum. ?                          - 2 cm hiatal hernia. ?Moderate Sedation: ?     Per Anesthesia Care ?Recommendation:           - Patient has a contact number available for  ?                          emergencies. The signs and symptoms of potential  ?                          delayed complications were discussed with the  ?                          patient. Return to normal activities tomorrow.  ?                          Written discharge  instructions were provided to the  ?                          patient. ?                          - Resume previous diet. ?                          - Continue present medications. ?                          - Await pathology results. ?                          - Repeat upper endoscopy in 3 years for  ?                           surveillance. ?                          - Return to GI clinic in 6 months. ?                          - Use a proton pump inhibitor PO daily. ?Procedure Code(s):        --- Professional --- ?                          [id number], Esophagogastroduodenoscopy, flexible,  ?                          transoral; with biopsy, single or multiple ?Diagnosis Code(s):        --- Professional --- ?                          K22.8, Other specified diseases of esophagus ?                          K29.70, Gastritis, unspecified, without bleeding ?                          Z13.810, Encounter for screening for upper  ?                          gastrointestinal disorder ?CPT copyright 2019 American Medical Association. All rights reserved. ?The codes documented in this report are preliminary and upon coder review may  ?be revised to meet current compliance requirements. ?Elon Alas. Abbey Chatters, DO ?Elon Alas. [place], DO ?10/03/2021 9:02:35 AM ?This report has been signed electronically. ?Number of Addenda: 0 ?

## 2021-10-03 NOTE — Anesthesia Preprocedure Evaluation (Signed)
Anesthesia Evaluation  ?Patient identified by MRN, date of birth, ID band ?Patient awake ? ? ? ?Reviewed: ?Allergy & Precautions, H&P , NPO status , Patient's Chart, lab work & pertinent test results, reviewed documented beta blocker date and time  ? ?Airway ?Mallampati: II ? ?TM Distance: >3 FB ?Neck ROM: full ? ? ? Dental ?no notable dental hx. ? ?  ?Pulmonary ?COPD, Current Smoker,  ?  ?Pulmonary exam normal ?breath sounds clear to auscultation ? ? ? ? ? ? Cardiovascular ?Exercise Tolerance: Good ?hypertension, + CAD and + Past MI  ? ?Rhythm:regular Rate:Normal ? ? ?  ?Neuro/Psych ?PSYCHIATRIC DISORDERS Depression negative neurological ROS ?   ? GI/Hepatic ?Neg liver ROS, GERD  Medicated,  ?Endo/Other  ?negative endocrine ROSdiabetes, Type 2 ? Renal/GU ?negative Renal ROS  ?negative genitourinary ?  ?Musculoskeletal ? ? Abdominal ?  ?Peds ? Hematology ?negative hematology ROS ?(+)   ?Anesthesia Other Findings ? ? Reproductive/Obstetrics ?negative OB ROS ? ?  ? ? ? ? ? ? ? ? ? ? ? ? ? ?  ?  ? ? ? ? ? ? ? ? ?Anesthesia Physical ?Anesthesia Plan ? ?ASA: 3 ? ?Anesthesia Plan: General  ? ?Post-op Pain Management:   ? ?Induction:  ? ?PONV Risk Score and Plan: Propofol infusion ? ?Airway Management Planned:  ? ?Additional Equipment:  ? ?Intra-op Plan:  ? ?Post-operative Plan:  ? ?Informed Consent: I have reviewed the patients History and Physical, chart, labs and discussed the procedure including the risks, benefits and alternatives for the proposed anesthesia with the patient or authorized representative who has indicated his/her understanding and acceptance.  ? ? ? ?Dental Advisory Given ? ?Plan Discussed with: CRNA ? ?Anesthesia Plan Comments:   ? ? ? ? ? ? ?Anesthesia Quick Evaluation ? ?

## 2021-10-04 ENCOUNTER — Encounter: Payer: Self-pay | Admitting: Women's Health

## 2021-10-04 ENCOUNTER — Ambulatory Visit: Payer: BC Managed Care – PPO | Admitting: Women's Health

## 2021-10-04 VITALS — BP 125/81 | HR 81 | Ht 64.0 in | Wt 164.0 lb

## 2021-10-04 DIAGNOSIS — L0292 Furuncle, unspecified: Secondary | ICD-10-CM

## 2021-10-04 MED ORDER — SULFAMETHOXAZOLE-TRIMETHOPRIM 800-160 MG PO TABS: 1.0000 | ORAL_TABLET | Freq: Two times a day (BID) | ORAL | 0 refills | Status: DC

## 2021-10-04 MED ORDER — SILVER SULFADIAZINE 1 % EX CREA: 1.0000 "application " | TOPICAL_CREAM | Freq: Every day | CUTANEOUS | 0 refills | Status: DC

## 2021-10-04 NOTE — Progress Notes (Signed)
? ?GYN VISIT ?Patient name: Alyssa Morales MRN [id number]  Date of birth: 05-Jun-1971 ?Chief Complaint:   ?Follow-up (TCA treatment) ? ?History of Present Illness:   ?Alyssa Morales is a 51 y.o. [id number] Caucasian female being seen today for boil near buttocks x 3d. Gets them usually 2-3 times/year in same general area. PCP usually gives her bactrim and they go away. Area where we excised vulvar condyloma last week feels good, no issues.     ?Patient's last menstrual period was 10/01/2016. ?The current method of family planning is post menopausal status.  ?Last pap 07/19/21. Results were: NILM w/ HRHPV negative ? ? ?  07/19/2021  ?  8:59 AM  ?Depression screen PHQ 2/9  ?Decreased Interest 0  ?Down, Depressed, Hopeless 1  ?PHQ - 2 Score 1  ?Altered sleeping 2  ?Tired, decreased energy 0  ?Change in appetite 2  ?Feeling bad or failure about yourself  0  ?Trouble concentrating 2  ?Moving slowly or fidgety/restless 0  ?Suicidal thoughts 0  ?PHQ-9 Score 7  ? ?  ? ?  07/19/2021  ?  8:59 AM  ?GAD 7 : Generalized Anxiety Score  ?Nervous, Anxious, on Edge 1  ?Control/stop worrying 2  ?Worry too much - different things 2  ?Trouble relaxing 1  ?Restless 2  ?Easily annoyed or irritable 2  ?Afraid - awful might happen 2  ?Total GAD 7 Score 12  ? ? ? ?Review of Systems:   ?Pertinent items are noted in HPI ?Denies fever/chills, dizziness, headaches, visual disturbances, fatigue, shortness of breath, chest pain, abdominal pain, vomiting, abnormal vaginal discharge/itching/odor/irritation, problems with periods, bowel movements, urination, or intercourse unless otherwise stated above.  ?Pertinent History Reviewed:  ?Reviewed past medical,surgical, social, obstetrical and family history.  ?Reviewed problem list, medications and allergies. ?Physical Assessment:  ? ?Vitals:  ? 10/04/21 0926  ?BP: 125/81  ?Pulse: 81  ?Weight: 164 lb (74.4 kg)  ?Height: '5\' 4"'$  (1.626 m)  ?Body mass index is 28.15 kg/m?. ? ?     Physical Examination:  ? General  appearance: alert, well appearing, and in no distress ? Mental status: alert, oriented to person, place, and time ? Skin: warm & dry  ? Cardiovascular: normal heart rate noted ? Respiratory: normal respiratory effort, no distress ? Abdomen: soft, non-tender  ? Pelvic: area where conyloma excised looks good ? Small soft slightly erythematous, slightly raised bump Lt lower vulva/buttocks, slightly tender ? Extremities: no edema  ? ?Chaperone: Celene Squibb   ? ?No results found for this or any previous visit (from the past 24 hour(s)).  ?Assessment & Plan:  ?1) Recurrent boil> not really infected right now, pt wants antibiotic to have in case it gets worse. Rx bactrim and silvadene. ? ?2) Previously excised vulvar condyloma> area well healed, pathology showed condyloma ? ?Meds:  ?Meds ordered this encounter  ?Medications  ? sulfamethoxazole-trimethoprim (BACTRIM DS) 800-160 MG tablet  ?  Sig: Take 1 tablet by mouth 2 (two) times daily. X 7 days  ?  Dispense:  14 tablet  ?  Refill:  0  ?  Order Specific Question:   Supervising Provider  ?  Answer:   Tania Ade H [[id number]]  ? silver sulfADIAZINE (SILVADENE) 1 % cream  ?  Sig: Apply 1 application. topically daily.  ?  Dispense:  50 g  ?  Refill:  0  ?  Order Specific Question:   Supervising Provider  ?  Answer:   Tania Ade H [[id number]]  ? ? ?  No orders of the defined types were placed in this encounter. ? ? ?Return for after 07/19/22 for physical. ? ?Roma Schanz CNM, WHNP-BC ?10/04/2021 ?9:55 AM  ?

## 2021-10-05 LAB — SURGICAL PATHOLOGY

## 2021-10-06 ENCOUNTER — Encounter (HOSPITAL_COMMUNITY): Payer: Self-pay | Admitting: Internal Medicine

## 2021-10-06 ENCOUNTER — Telehealth: Payer: Self-pay | Admitting: Internal Medicine

## 2021-10-06 MED ORDER — OMEPRAZOLE 40 MG PO CPDR: 40.0000 mg | DELAYED_RELEASE_CAPSULE | Freq: Two times a day (BID) | ORAL | 5 refills | Status: AC

## 2021-10-06 NOTE — Telephone Encounter (Signed)
Will call patient once we receive June schedule ?

## 2021-10-06 NOTE — Telephone Encounter (Signed)
Will call pt to schedule EGD when Dr. Ave Filter June schedule is available. ?

## 2021-10-06 NOTE — Telephone Encounter (Signed)
Called and discussed patient's path results with her. ? ?Polyps removed from the colon tubular adenoma and sessile serrated polyp.  Repeat colonoscopy in 5 years. ? ?Her esophageal biopsies consistent with Barrett's esophagus.  A few biopsies were indefinite for high-grade dysplasia.  Discussed this with pathologist as well. ? ?I am going to increase her omeprazole to twice daily. ? ?She will need repeat EGD in 12 weeks.  Can we arrange this?  Diagnosis Barrett's esophagus.  ASA 3.  Thank you ?

## 2021-10-25 ENCOUNTER — Encounter: Payer: Self-pay | Admitting: Internal Medicine

## 2021-10-25 ENCOUNTER — Ambulatory Visit (INDEPENDENT_AMBULATORY_CARE_PROVIDER_SITE_OTHER): Payer: BC Managed Care – PPO | Admitting: Internal Medicine

## 2021-10-25 DIAGNOSIS — I10 Essential (primary) hypertension: Secondary | ICD-10-CM | POA: Diagnosis not present

## 2021-10-25 DIAGNOSIS — J449 Chronic obstructive pulmonary disease, unspecified: Secondary | ICD-10-CM

## 2021-10-25 DIAGNOSIS — F17210 Nicotine dependence, cigarettes, uncomplicated: Secondary | ICD-10-CM | POA: Diagnosis not present

## 2021-10-25 MED ORDER — IRBESARTAN 150 MG PO TABS: 150.0000 mg | ORAL_TABLET | Freq: Every day | ORAL | 11 refills | Status: DC

## 2021-10-25 NOTE — Assessment & Plan Note (Signed)
D/c  acei 10/25/2021  ? ? ?In the best review of chronic cough to date ( NEJM 2016 375 [phone]) ,  ACEi are now felt to cause cough in up to  20% of pts which is a 4 fold increase from previous reports and does not include the variety of non-specific complaints we see in pulmonary clinic in pts on ACEi but previously attributed to another dx like  Copd/asthma that doesn't respond to saba,  and  include PNDS, throat and chest congestion, "bronchitis", unexplained dyspnea and noct "strangling" sensations, and hoarseness, but also  atypical /refractory GERD symptoms like dysphagia and "bad heartburn"  ? ?The only way I know  to prove this is not an "ACEi Case" is a trial off ACEi x a minimum of 6 weeks then regroup.  ? ? ?>>> try avaprop 150 mg daily x 6 weeks then regroup if not better - if better, return to PCP for f/u  ?

## 2021-10-25 NOTE — Assessment & Plan Note (Addendum)
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 ?GOLD ? But likely clinically Group A for which prn saba approp for now and of course efforts to stop smoking (see separate a/p)  ? ?Re SABA :  I spent extra time with pt today reviewing appropriate use of albuterol for prn use on exertion with the following points: ?1) saba is for relief of sob that does not improve by walking a slower pace or resting but rather if the pt does not improve after trying this first. ?2) If the pt is convinced, as many are, that saba helps recover from activity faster then it's easy to tell if this is the case by re-challenging : ie stop, take the inhaler, then p 5 minutes try the exact same activity (intensity of workload) that just caused the symptoms and see if they are substantially diminished or not after saba ?3) if there is an activity that reproducibly causes the symptoms, try the saba 15 min before the activity on alternate days  ? ?If in fact the saba really does help, then fine to continue to use it prn but advised may need to look closer at the maintenance regimen being used to achieve better control of airways disease with exertion.   ?

## 2021-10-25 NOTE — Progress Notes (Signed)
? ?Alyssa Morales, female    DOB: 13-Jun-1971,   MRN: [id number] ? ? ?Brief patient profile:  ?71  yowf  CNA active smoker with tendency to cough /wheeze with colds esp winter with prn saba  referred to pulmonary clinic in [place]  10/25/2021 by Dr Julieta Gutting  for copd eval p eval in eval in ER for acute bronchitis with hemoptysis  with CTa 08/31/21 neg for ca but suggestive of copd  ? ? ? ? ?History of Present Illness  ?10/25/2021  Pulmonary/ 1st office eval/ Melvyn Novas / [place] [facility] on ACEi ?Chief Complaint  ?Patient presents with  ? Consult  ?  Ref by Dr. 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 ?No obvious day to day or daytime variability or assoc excess/ purulent sputum or mucus plugs or recurrnt hemoptysis or cp or chest tightness, subjective wheeze or overt sinus or hb symptoms.  ? ?Sleeping as above without nocturnal  exacerbation  of respiratory  c/o's or need for noct saba. Also denies any obvious fluctuation of symptoms with weather or environmental changes or other aggravating or alleviating factors except as outlined above  ? ?No unusual exposure hx or h/o childhood pna/ asthma or knowledge of premature birth. ? ?Current Allergies, Complete Past Medical History, Past Surgical History, Family History, and Social History were reviewed in [employer] record. ? ?ROS  The following are not active complaints unless bolded ?Hoarseness, sore throat, dysphagia, dental problems, itching, sneezing,  nasal congestion or discharge of excess mucus or purulent secretions, ear ache,   fever, chills, sweats, unintended wt loss or wt gain, classically pleuritic or exertional cp,  orthopnea pnd or arm/hand swelling  or leg swelling, presyncope, palpitations,  abdominal pain, anorexia, nausea, vomiting, diarrhea  or change in bowel habits or change in bladder habits, change in stools or change in urine, dysuria, hematuria,  rash, arthralgias, visual complaints, headache, numbness, weakness or ataxia or problems with walking or coordination,  change in mood or  memory. ?      ?   ? ? ? ?Past Medical History:  ?Diagnosis Date  ? CAD (coronary artery disease)   ? a. NSTEMI (peak trop 0.06) 10/2014 - cath showing first diagonal with 50-70% ostial narrowing, small in distribution, widely patent LAD/LCx/RCA, EF normal.  ? COPD (chronic obstructive pulmonary disease) ([place])   ? Diabetes mellitus with complication ([place])   ? Dyslipidemia   ? Hypertension   ? Hypertriglyceridemia   ? NSTEMI (non-ST elevated myocardial infarction) ([place])   ? Obesity   ? Tobacco abuse   ? ? ?Outpatient Medications Prior to Visit  ?Medication Sig Dispense Refill  ? albuterol (PROVENTIL HFA;VENTOLIN HFA) 108 (90 BASE) MCG/ACT inhaler Inhale 1 puff into the lungs every 6 (six) hours as needed for wheezing or shortness of breath.    ? aspirin EC 81 MG EC tablet Take 1 tablet (81 mg total) by mouth daily. 30 tablet 11  ? buPROPion (WELLBUTRIN XL) 300 MG 24 hr tablet Take 300 mg by mouth every morning.    ? Cholecalciferol (VITAMIN D3) 125 MCG (5000 UT) CAPS Take 5,000 Units by mouth daily.    ? hydrOXYzine (ATARAX) 25 MG tablet Take 25-50  mg by mouth See admin instructions. Take 50 mg by mouth at bedtime, may take 25 mg during the day if needed.    ? metFORMIN (GLUCOPHAGE) 500 MG tablet Take 2 tablets (1,000 mg total) by mouth 2 (two) times daily with a meal. 60 tablet 2  ? Multiple Vitamins-Minerals (WOMENS MULTIVITAMIN PO) Take 1 tablet by mouth daily.    ? naproxen (NAPROSYN) 500 MG tablet Take 500 mg by mouth 2 (two) times daily.    ? nitroGLYCERIN (NITROSTAT) 0.4 MG SL tablet Place 1 tablet (0.4 mg total) under the tongue every 5 (five) minutes as needed for chest pain. 30 tablet 1  ? omega-3 acid ethyl  esters (LOVAZA) 1 G capsule Take 2 capsules (2 g total) by mouth 2 (two) times daily. (Patient taking differently: Take 2 g by mouth daily. Takes once daily.) 120 capsule 3  ? omeprazole (PRILOSEC) 40 MG capsule Take 1 capsule (40 mg total) by mouth in the morning and at bedtime. 60 capsule 5  ? pravastatin (PRAVACHOL) 40 MG tablet Take 40 mg by mouth daily.    ? Semaglutide (OZEMPIC, 0.25 OR 0.5 MG/DOSE, ) Inject 0.25 mg into the skin once a week.    ? TRESIBA FLEXTOUCH 100 UNIT/ML FlexTouch Pen Inject 15 Units into the skin daily.    ? lisinopril (PRINIVIL,ZESTRIL) 20 MG tablet Take 1 tablet (20 mg total) by mouth daily. 30 tablet 2  ? silver sulfADIAZINE (SILVADENE) 1 % cream Apply 1 application. topically daily. 50 g 0  ? sulfamethoxazole-trimethoprim (BACTRIM DS) 800-160 MG tablet Take 1 tablet by mouth 2 (two) times daily. X 7 days 14 tablet 0  ? trichloroacetic acid 80 % LIQD Dispense for in office use (Patient not taking: Reported on 10/04/2021) 15 mL 0  ? ?No facility-administered medications prior to visit.  ? ? ? ?Objective:  ?  ? ?BP 138/90 (BP Location: Left Arm, Patient Position: Sitting)   Pulse (!) 102   Temp 98.7 ?F (37.1 ?C) (Temporal)   Ht '5\' 4"'$  (1.626 m)   Wt 156 lb 6.4 oz (70.9 kg)   LMP 10/01/2016   SpO2 97% Comment: ra  BMI 26.85 kg/m?  ? ?SpO2: 97 % (ra) ? ?Amb hoarse wf nad ? ?HEENT : nl exam though freq thorat clearing noted   ? ?NECK :  without JVD/Nodes/TM/ nl carotid upstrokes bilaterally ? ? ?LUNGS: no acc muscle use,  Min barrel  contour chest wall with bilateral  slightly decreased bs s audible wheeze and  without cough on insp or exp maneuvers and min  Hyperresonant  to  percussion bilaterally   ? ? ?CV:  RRR  no s3 or murmur or increase in P2, and no edema  ? ?ABD:  soft and nontender with pos end  insp Hoover's  in the supine position. No bruits or organomegaly appreciated, bowel sounds nl ? ?MS:   Nl gait/  ext warm without deformities, calf tenderness, cyanosis or  clubbing ?No obvious joint restrictions  ? ?SKIN: warm and dry without lesions   ? ?NEURO:  alert, approp, nl sensorium with  no motor or cerebellar deficits apparent.  ?    ? ? ?I personally reviewed images and agree with radiology impression as follows:  ?Chest CTa 08/31/21 ?Mosaic attenuation throughout the lungs is suggestive of air ?trapping/small airway disease. ? ? ?   ?Assessment  ? ?COPD  GOLD ? / active smoker ?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 ?GOLD ? But likely clinically Group A for which prn saba approp for now and of course efforts to stop smoking (see separate a/p)  ? ?Re SABA :  I spent extra time with pt today reviewing appropriate use of albuterol for prn use on exertion with the following points: ?1) saba is for relief of sob that does not improve by walking a slower pace or resting but rather if the pt does not improve after trying this first. ?2) If the pt is convinced, as many are, that saba helps recover from activity faster then it's easy to tell if this is the case by re-challenging : ie stop, take the inhaler, then p 5 minutes try the exact same activity (intensity of workload) that just caused the symptoms and see if they are substantially diminished or not after saba ?3) if there is an activity that reproducibly causes the symptoms, try the saba 15 min before the activity on alternate days  ? ?If in fact the saba really does help, then fine to continue to use it prn but advised may need to look closer at the maintenance regimen being used to achieve better control of airways disease with exertion.   ? ? ? ?Essential hypertension ?D/c  acei  10/25/2021  ? ? ?In the best review of chronic cough to date ( NEJM 2016 375 [id number]) ,  ACEi are now felt to cause cough in up to  20% of pts which is a 4 fold increase from previous reports and does not include the variety

## 2021-10-25 NOTE — Assessment & Plan Note (Signed)
Counseled re importance of smoking cessation but did not meet time criteria for separate billing     Each maintenance medication was reviewed in detail including emphasizing most importantly the difference between maintenance and prns and under what circumstances the prns are to be triggered using an action plan format where appropriate.  Total time for H and P, chart review, counseling, reviewing hfa device(s) , directly observing portions of ambulatory 02 saturation study/ and generating customized AVS unique to this office visit / same day charting  > 45 min new pt eval              

## 2021-10-25 NOTE — Patient Instructions (Addendum)
Ok to try albuterol 15 min before an activity (on alternating days)  that you know would usually make you short of breath and see if it makes any difference and if makes none then don't take albuterol after activity unless you can't catch your breath as this means it's the resting that helps, not the albuterol. ?    ? ?Stop lisinopril and start avapro 150 mg one daily in its place ? ?The key is to stop smoking completely before smoking completely stops you! ? ?I will be referring you for annual lung cancer screening 0   ? ?Please schedule a follow up office visit in 6 weeks, call sooner if needed with pfts (ok to use [place] for this if can't get it done by 6 weeks ) ?

## 2021-10-28 ENCOUNTER — Ambulatory Visit: Payer: BC Managed Care – PPO | Admitting: Internal Medicine

## 2021-10-28 DIAGNOSIS — J449 Chronic obstructive pulmonary disease, unspecified: Secondary | ICD-10-CM

## 2021-10-28 LAB — PULMONARY FUNCTION TEST
DL/VA % pred: 86 %
DL/VA: 3.72 ml/min/mmHg/L
DLCO cor % pred: 87 %
DLCO cor: 18.5 ml/min/mmHg
DLCO unc % pred: 87 %
DLCO unc: 18.5 ml/min/mmHg
FEF 25-75 Post: 2.51 L/sec
FEF 25-75 Pre: 2.33 L/sec
FEF2575-%Change-Post: 7 %
FEF2575-%Pred-Post: 90 %
FEF2575-%Pred-Pre: 84 %
FEV1-%Change-Post: 0 %
FEV1-%Pred-Post: 91 %
FEV1-%Pred-Pre: 90 %
FEV1-Post: 2.56 L
FEV1-Pre: 2.54 L
FEV1FVC-%Change-Post: 0 %
FEV1FVC-%Pred-Pre: 100 %
FEV6-%Change-Post: 0 %
FEV6-%Pred-Post: 92 %
FEV6-%Pred-Pre: 91 %
FEV6-Post: 3.18 L
FEV6-Pre: 3.16 L
FEV6FVC-%Pred-Post: 102 %
FEV6FVC-%Pred-Pre: 102 %
FVC-%Change-Post: 0 %
FVC-%Pred-Post: 89 %
FVC-%Pred-Pre: 89 %
FVC-Post: 3.18 L
FVC-Pre: 3.17 L
Post FEV1/FVC ratio: 81 %
Post FEV6/FVC ratio: 100 %
Pre FEV1/FVC ratio: 80 %
Pre FEV6/FVC Ratio: 100 %
RV % pred: 116 %
RV: 2.08 L
TLC % pred: 103 %
TLC: 5.24 L

## 2021-11-17 NOTE — Telephone Encounter (Signed)
Called pt. She has been scheduled for 6/26. Aware will mail prep instructions and pre-op appt.  ?

## 2021-11-18 ENCOUNTER — Encounter: Payer: Self-pay | Admitting: *Deleted

## 2021-12-06 ENCOUNTER — Ambulatory Visit (INDEPENDENT_AMBULATORY_CARE_PROVIDER_SITE_OTHER): Payer: BC Managed Care – PPO | Admitting: Internal Medicine

## 2021-12-06 ENCOUNTER — Encounter: Payer: Self-pay | Admitting: Internal Medicine

## 2021-12-06 DIAGNOSIS — J449 Chronic obstructive pulmonary disease, unspecified: Secondary | ICD-10-CM | POA: Diagnosis not present

## 2021-12-06 DIAGNOSIS — I10 Essential (primary) hypertension: Secondary | ICD-10-CM

## 2021-12-06 DIAGNOSIS — F17210 Nicotine dependence, cigarettes, uncomplicated: Secondary | ICD-10-CM

## 2021-12-06 NOTE — Assessment & Plan Note (Addendum)
Changed acei to arb 10/25/21 >  Improved 12/06/2021   Although even in retrospect it may not be clear the ACEi contributed to the pt's symptoms,  pt improved off them and adding them back at this point or in the future would risk confusion in interpretation of non-specific respiratory symptoms to which this patient is prone  ie  Better not to muddy the waters here.  >>> rec continue avapro 150 mg daily

## 2021-12-06 NOTE — Assessment & Plan Note (Addendum)
Active smoker  - CTa 08/31/21 c/w mosaic attenuation/ ? Small airways dz   -  10/25/2021  The proper method of use, as well as anticipated side effects, of a metered-dose inhaler were discussed and demonstrated to the patient using teach back method  - 10/25/2021   Walked on RA  x  3  lap(s) =  approx 450  ft  @ mod pace, stopped due to end of study s sob with lowest 02 sats 96%   - PFT's  10/28/21  Nl airflow and  FV curve  and ERV 36%  at wt 153    - Allergy screen 12/06/2021 >  Eos 0. /  IgE pending   Improving with intentional wt loss to her satisfaction / no evidence of copd although she is at risk.  > 3 min discussion I reviewed the Fletcher curve with the patient that basically indicates  if you quit smoking when your best day FEV1 is still well preserved (as is clearly  the case here)  it is highly unlikely you will progress to severe disease and informed the patient there was  no medication on the market that has proven to alter the curve/ its downward trajectory  or the likelihood of progression of their disease(unlike other chronic medical conditions such as atheroclerosis where we do think we can change the natural hx with risk reducing meds)    Therefore stopping smoking and maintaining abstinence are  the most important aspects of care, not choice of inhalers or for that matter pulmonarhy  doctors.   Treatment other than smoking cessation  is entirely directed by severity of symptoms and focused also on reducing exacerbations, not attempting to change the natural history of the disease, so no f/u is needed for now

## 2021-12-06 NOTE — Assessment & Plan Note (Addendum)
Counseled re importance of smoking cessation but did not meet time criteria for separate billing    Low-dose CT lung cancer screening is recommended for patients who are 60-51 years of age with a 20+ pack-year history of smoking and who are currently smoking or quit <=15 years ago. No coughing up blood  No unintentional weight loss of > 15 pounds in the last 6 months - pt is eligible for scanning yearly until at least age 90 or 15 years p quits smoking>> referred          Each maintenance medication was reviewed in detail including emphasizing most importantly the difference between maintenance and prns and under what circumstances the prns are to be triggered using an action plan format where appropriate.  Total time for H and P, chart review, counseling,  and generating customized AVS unique to this office visit / same day charting  > 30 min for final summary f/u ov

## 2021-12-06 NOTE — Progress Notes (Signed)
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 I will be referring you for annual lung cancer screening     - PFT's  10/28/21  Nl airflow and  FV curve  and ERV 36%  at wt 153      12/06/2021  f/u ov/[place] office/Aastha Dayley re: doe maint on off acei   Chief Complaint  Patient presents with   Follow-up    Was coughing up blood and went to ER in feb and was told she had copd and then after breathing test was told no copd. Patient wants clarification.   Dyspnea:  marked improvement with intentional wt loss  Cough:  assoc with wheezing / nasal drainage   Sleeping: able to lie flat/ 1 pillow  SABA use: proair rarely needed x one week  02: nont  Covid status: vax x 4  Lung cancer screening: referred    No obvious day to day or daytime variability or assoc excess/ purulent sputum or mucus plugs or hemoptysis or cp or chest tightness, subjective wheeze or overt  sinus or hb symptoms.   Sleeping  without nocturnal  or early am exacerbation  of respiratory  c/o's or need for noct saba. Also denies any obvious fluctuation of symptoms with weather or environmental changes or other aggravating or alleviating factors except as outlined above   No unusual exposure hx or h/o childhood pna/ asthma or knowledge of premature birth.  Current Allergies, Complete Past Medical History, Past Surgical History, Family History, and Social History were reviewed in [employer] record.  ROS  The following are not active complaints unless bolded Hoarseness, sore throat, dysphagia, dental problems, itching, sneezing,  nasal congestion or discharge of excess mucus or purulent secretions, ear ache,   fever, chills, sweats, unintended wt loss or wt gain, classically pleuritic or exertional cp,  orthopnea pnd or arm/hand swelling  or leg swelling, presyncope, palpitations, abdominal pain, anorexia, nausea, vomiting, diarrhea  or change in bowel habits or change in bladder habits, change in stools or change in urine, dysuria, hematuria,  rash, arthralgias, visual complaints, headache, numbness, weakness or ataxia or problems with walking or coordination,  change in mood or  memory.        Current Meds  Medication Sig  albuterol (PROVENTIL HFA;VENTOLIN HFA) 108 (90 BASE) MCG/ACT inhaler Inhale 1 puff into the lungs every 6 (six) hours as needed for wheezing or shortness of breath.   aspirin EC 81 MG EC tablet Take 1 tablet (81 mg total) by mouth daily.   buPROPion (WELLBUTRIN XL) 300 MG 24 hr tablet Take 300 mg by mouth every morning.   Cholecalciferol (VITAMIN D3) 125 MCG (5000 UT) CAPS Take 5,000 Units by mouth daily.   guaiFENesin (MUCINEX) 600 MG 12 hr tablet Take by mouth 2 (two) times daily.   hydrOXYzine (ATARAX) 25 MG tablet Take 25-50 mg by mouth See admin instructions. Take 50 mg by mouth at bedtime, may take 25 mg during the day if needed.    irbesartan (AVAPRO) 150 MG tablet Take 1 tablet (150 mg total) by mouth daily.   metFORMIN (GLUCOPHAGE) 500 MG tablet Take 2 tablets (1,000 mg total) by mouth 2 (two) times daily with a meal.   Multiple Vitamins-Minerals (WOMENS MULTIVITAMIN PO) Take 1 tablet by mouth daily.   naproxen (NAPROSYN) 500 MG tablet Take 500 mg by mouth 2 (two) times daily.   nitroGLYCERIN (NITROSTAT) 0.4 MG SL tablet Place 1 tablet (0.4 mg total) under the tongue every 5 (five) minutes as needed for chest pain.   omega-3 acid ethyl esters (LOVAZA) 1 G capsule Take 2 capsules (2 g total) by mouth 2 (two) times daily. (Patient taking differently: Take 2 g by mouth daily. Takes once daily.)   omeprazole (PRILOSEC) 40 MG capsule Take 1 capsule (40 mg total) by mouth in the morning and at bedtime.   pravastatin (PRAVACHOL) 40 MG tablet Take 40 mg by mouth daily.   Semaglutide (OZEMPIC, 0.25 OR 0.5 MG/DOSE, [place]) Inject 0.25 mg into the skin once a week.   TRESIBA FLEXTOUCH 100 UNIT/ML FlexTouch Pen Inject 15 Units into the skin daily.            Past Medical History:  Diagnosis Date   CAD (coronary artery disease)    a. NSTEMI (peak trop 0.06) 10/2014 - cath showing first diagonal with 50-70% ostial narrowing, small in distribution, widely patent LAD/LCx/RCA, EF normal.   COPD (chronic obstructive pulmonary disease) ([facility])    Diabetes mellitus with complication ([facility])    Dyslipidemia    Hypertension    Hypertriglyceridemia    NSTEMI (non-ST elevated myocardial infarction) ([place])    Obesity    Tobacco abuse        Objective:       Wt Readings from Last 3 Encounters:  12/06/21 158 lb 6.4 oz (71.8 kg)  10/25/21 156 lb 6.4 oz (70.9 kg)  10/04/21 164 lb (74.4 kg)      Vital signs reviewed  12/06/2021  - Note at rest 02 sats  98% on RA   General appearance:    amb hoarse wf        HEENT : Oropharynx  clear  Nasal turbintes nl    NECK :  without  appent JVD/ palpable Nodes/TM    LUNGS: no acc muscle use,   Nl contour chest which is clear to A and P bilaterally without cough on insp or exp maneuvers   CV:  RRR  no s3 or murmur or increase in P2, and no edema   ABD:  soft and nontender with nl inspiratory excursion in the supine position. No bruits or organomegaly appreciated   MS:  Nl gait/ ext warm without deformities Or obvious joint restrictions  calf tenderness, cyanosis or clubbing  SKIN: warm and dry without lesions    NEURO:  alert, approp, nl sensorium with  no motor or cerebellar deficits apparent.             Assessment

## 2021-12-06 NOTE — Patient Instructions (Addendum)
My office will be contacting you by phone for referral to the lung cancer screening program  The key is to stop smoking completely before smoking completely stops you!  Only use your albuterol as a rescue medication to be used if you can't catch your breath by resting or doing a relaxed purse lip breathing pattern.  - The less you use it, the better it will work when you need it. - Ok to use up to 2 puffs  every 4 hours if you must but call for immediate appointment if use goes up over your usual need - Don't leave home without it !!  (think of it like the spare tire for your car)   Also Ok to try albuterol 15 min before an activity (on alternating days)  that you know would usually make you short of breath and see if it makes any difference and if makes none then don't take albuterol after activity unless you can't catch your breath as this means it's the resting that helps, not the albuterol.     Please remember to go to the lab department   for your tests - we will call you with the results when they are available.        If you are satisfied with your treatment plan,  let your doctor know and he/she can either refill your medications or you can return here when your prescription runs out.     If in any way you are not 100% satisfied,  please tell us.  If 100% better, tell your friends!  Pulmonary follow up is as needed

## 2021-12-09 ENCOUNTER — Other Ambulatory Visit: Payer: Self-pay

## 2021-12-09 DIAGNOSIS — Z889 Allergy status to unspecified drugs, medicaments and biological substances status: Secondary | ICD-10-CM

## 2021-12-09 LAB — CBC WITH DIFFERENTIAL/PLATELET
Basophils Absolute: 0.1 10*3/uL (ref 0.0–0.2)
Basos: 1 %
EOS (ABSOLUTE): 0.2 10*3/uL (ref 0.0–0.4)
Eos: 2 %
Hematocrit: 37.1 % (ref 34.0–46.6)
Hemoglobin: 12.1 g/dL (ref 11.1–15.9)
Immature Grans (Abs): 0 10*3/uL (ref 0.0–0.1)
Immature Granulocytes: 0 %
Lymphocytes Absolute: 1.9 10*3/uL (ref 0.7–3.1)
Lymphs: 16 %
MCH: 29.5 pg (ref 26.6–33.0)
MCHC: 32.6 g/dL (ref 31.5–35.7)
MCV: 91 fL (ref 79–97)
Monocytes Absolute: 0.7 10*3/uL (ref 0.1–0.9)
Monocytes: 6 %
Neutrophils Absolute: 9.5 10*3/uL — ABNORMAL HIGH (ref 1.4–7.0)
Neutrophils: 75 %
Platelets: 401 10*3/uL (ref 150–450)
RBC: 4.1 x10E6/uL (ref 3.77–5.28)
RDW: 13.1 % (ref 11.7–15.4)
WBC: 12.4 10*3/uL — ABNORMAL HIGH (ref 3.4–10.8)

## 2021-12-09 LAB — IGE: IgE (Immunoglobulin E), Serum: 142 IU/mL (ref 6–495)

## 2021-12-27 NOTE — Patient Instructions (Signed)
Alyssa Morales  12/27/2021     '@PREFPERIOPPHARMACY'$ @   Your procedure is scheduled on  01/02/2022.   Report to [facility] at   0600  A.M.   Call this number if you have problems the morning of surgery:  [phone]   Remember:  Follow the diet instructions given to you by the office.     Use your inhaler before you come and bring your rescue inhaler with you.    Take these medicines the morning of surgery with A SIP OF WATER              wellbutrin, atarax, prilosec, zofran (if needed).     Do not wear jewelry, make-up or nail polish.  Do not wear lotions, powders, or perfumes, or deodorant.  Do not shave 48 hours prior to surgery.  Men may shave face and neck.  Do not bring valuables to the hospital.  [facility] is not responsible for any belongings or valuables.  Contacts, dentures or bridgework may not be worn into surgery.  Leave your suitcase in the car.  After surgery it may be brought to your room.  For patients admitted to the hospital, discharge time will be determined by your treatment team.  Patients discharged the day of surgery will not be allowed to drive home and must have someone with them for 24 hours.    Special instructions:   DO NOT smoke tobacco or vape for 24 hours before your procedure.  Please read over the following fact sheets that you were given. Anesthesia Post-op Instructions and Care and Recovery After Surgery      Upper Endoscopy, Adult, Care After This sheet gives you information about how to care for yourself after your procedure. Your health care provider may also give you more specific instructions. If you have problems or questions, contact your health care provider. What can I expect after the procedure? After the procedure, it is common to have: A sore throat. Mild stomach pain or discomfort. Bloating. Nausea. Follow these instructions at home:  Follow instructions from your health care provider about what to eat or  drink after your procedure. Return to your normal activities as told by your health care provider. Ask your health care provider what activities are safe for you. Take over-the-counter and prescription medicines only as told by your health care provider. If you were given a sedative during the procedure, it can affect you for several hours. Do not drive or operate machinery until your health care provider says that it is safe. Keep all follow-up visits as told by your health care provider. This is important. Contact a health care provider if you have: A sore throat that lasts longer than one day. Trouble swallowing. Get help right away if: You vomit blood or your vomit looks like coffee grounds. You have: A fever. Bloody, black, or tarry stools. A severe sore throat or you cannot swallow. Difficulty breathing. Severe pain in your chest or abdomen. Summary After the procedure, it is common to have a sore throat, mild stomach discomfort, bloating, and nausea. If you were given a sedative during the procedure, it can affect you for several hours. Do not drive or operate machinery until your health care provider says that it is safe. Follow instructions from your health care provider about what to eat or drink after your procedure. Return to your normal activities as told by your health care provider. This information is not intended to  replace advice given to you by your health care provider. Make sure you discuss any questions you have with your health care provider. Document Revised: 05/02/2019 Document Reviewed: 11/26/2017 Elsevier Patient Education  [place] After This sheet gives you information about how to care for yourself after your procedure. Your health care provider may also give you more specific instructions. If you have problems or questions, contact your health care provider. What can I expect after the procedure? After the procedure,  it is common to have: Tiredness. Forgetfulness about what happened after the procedure. Impaired judgment for important decisions. Nausea or vomiting. Some difficulty with balance. Follow these instructions at home: For the time period you were told by your health care provider:     Rest as needed. Do not participate in activities where you could fall or become injured. Do not drive or use machinery. Do not drink alcohol. Do not take sleeping pills or medicines that cause drowsiness. Do not make important decisions or sign legal documents. Do not take care of children on your own. Eating and drinking Follow the diet that is recommended by your health care provider. Drink enough fluid to keep your urine pale yellow. If you vomit: Drink water, juice, or soup when you can drink without vomiting. Make sure you have little or no nausea before eating solid foods. General instructions Have a responsible adult stay with you for the time you are told. It is important to have someone help care for you until you are awake and alert. Take over-the-counter and prescription medicines only as told by your health care provider. If you have sleep apnea, surgery and certain medicines can increase your risk for breathing problems. Follow instructions from your health care provider about wearing your sleep device: Anytime you are sleeping, including during daytime naps. While taking prescription pain medicines, sleeping medicines, or medicines that make you drowsy. Avoid smoking. Keep all follow-up visits as told by your health care provider. This is important. Contact a health care provider if: You keep feeling nauseous or you keep vomiting. You feel light-headed. You are still sleepy or having trouble with balance after 24 hours. You develop a rash. You have a fever. You have redness or swelling around the IV site. Get help right away if: You have trouble breathing. You have new-onset  confusion at home. Summary For several hours after your procedure, you may feel tired. You may also be forgetful and have poor judgment. Have a responsible adult stay with you for the time you are told. It is important to have someone help care for you until you are awake and alert. Rest as told. Do not drive or operate machinery. Do not drink alcohol or take sleeping pills. Get help right away if you have trouble breathing, or if you suddenly become confused. This information is not intended to replace advice given to you by your health care provider. Make sure you discuss any questions you have with your health care provider. Document Revised: 05/31/2021 Document Reviewed: 05/29/2019 Elsevier Patient Education  [place].

## 2021-12-29 ENCOUNTER — Encounter (HOSPITAL_COMMUNITY): Payer: Self-pay

## 2021-12-29 ENCOUNTER — Encounter (HOSPITAL_COMMUNITY)
Admission: RE | Admit: 2021-12-29 | Discharge: 2021-12-29 | Disposition: A | Discharge status: Home / Self Care | Payer: BC Managed Care – PPO | Source: Ambulatory Visit | Attending: Internal Medicine | Admitting: Internal Medicine

## 2021-12-29 DIAGNOSIS — E1140 Type 2 diabetes mellitus with diabetic neuropathy, unspecified: Secondary | ICD-10-CM | POA: Diagnosis present

## 2021-12-29 HISTORY — DX: Depression, unspecified: F32.A

## 2021-12-29 HISTORY — DX: Unspecified osteoarthritis, unspecified site: M19.90

## 2021-12-29 HISTORY — DX: Anxiety disorder, unspecified: F41.9

## 2021-12-29 LAB — BASIC METABOLIC PANEL
Anion gap: 4 — ABNORMAL LOW (ref 5–15)
BUN: 21 mg/dL — ABNORMAL HIGH (ref 6–20)
CO2: 27 mmol/L (ref 22–32)
Calcium: 9.5 mg/dL (ref 8.9–10.3)
Chloride: 109 mmol/L (ref 98–111)
Creatinine, Ser: 0.75 mg/dL (ref 0.44–1.00)
GFR, Estimated: >60 mL/min (ref >60)
Glucose, Bld: 116 mg/dL — ABNORMAL HIGH (ref 70–99)
Potassium: 3.9 mmol/L (ref 3.5–5.1)
Sodium: 140 mmol/L (ref 135–145)

## 2022-01-02 ENCOUNTER — Ambulatory Visit (HOSPITAL_COMMUNITY): Payer: BC Managed Care – PPO

## 2022-01-02 ENCOUNTER — Encounter (HOSPITAL_COMMUNITY): Payer: Self-pay

## 2022-01-02 ENCOUNTER — Encounter (HOSPITAL_COMMUNITY)
Admission: RE | Disposition: A | Discharge status: Home / Self Care | Payer: Self-pay | Source: Home / Self Care | Attending: Internal Medicine

## 2022-01-02 ENCOUNTER — Ambulatory Visit (HOSPITAL_COMMUNITY)
Admission: RE | Admit: 2022-01-02 | Discharge: 2022-01-02 | Disposition: A | Discharge status: Home / Self Care | Payer: BC Managed Care – PPO | Attending: Internal Medicine | Admitting: Internal Medicine

## 2022-01-02 DIAGNOSIS — F17210 Nicotine dependence, cigarettes, uncomplicated: Secondary | ICD-10-CM | POA: Insufficient documentation

## 2022-01-02 DIAGNOSIS — K2100 Gastro-esophageal reflux disease with esophagitis, without bleeding: Secondary | ICD-10-CM | POA: Diagnosis not present

## 2022-01-02 DIAGNOSIS — K2270 Barrett's esophagus without dysplasia: Secondary | ICD-10-CM

## 2022-01-02 DIAGNOSIS — K22711 Barrett's esophagus with high grade dysplasia: Secondary | ICD-10-CM | POA: Diagnosis not present

## 2022-01-02 DIAGNOSIS — I10 Essential (primary) hypertension: Secondary | ICD-10-CM | POA: Diagnosis not present

## 2022-01-02 DIAGNOSIS — J449 Chronic obstructive pulmonary disease, unspecified: Secondary | ICD-10-CM | POA: Insufficient documentation

## 2022-01-02 DIAGNOSIS — E1140 Type 2 diabetes mellitus with diabetic neuropathy, unspecified: Secondary | ICD-10-CM

## 2022-01-02 DIAGNOSIS — K449 Diaphragmatic hernia without obstruction or gangrene: Secondary | ICD-10-CM | POA: Insufficient documentation

## 2022-01-02 HISTORY — PX: BIOPSY: SHX5522

## 2022-01-02 HISTORY — PX: ESOPHAGOGASTRODUODENOSCOPY (EGD) WITH PROPOFOL: SHX5813

## 2022-01-02 LAB — GLUCOSE, CAPILLARY: Glucose-Capillary: 156 mg/dL — ABNORMAL HIGH (ref 70–99)

## 2022-01-02 SURGERY — ESOPHAGOGASTRODUODENOSCOPY (EGD) WITH PROPOFOL
Anesthesia: General

## 2022-01-02 MED ORDER — PROPOFOL 10 MG/ML IV BOLUS
INTRAVENOUS | Status: DC | PRN
  Administered 2022-01-02: 100 mg via INTRAVENOUS
  Administered 2022-01-02: 50 mg via INTRAVENOUS
  Administered 2022-01-02: 100 mg via INTRAVENOUS

## 2022-01-02 MED ORDER — LACTATED RINGERS IV SOLN: INTRAVENOUS | Status: DC | PRN

## 2022-01-02 MED ORDER — LACTATED RINGERS IV SOLN
INTRAVENOUS | Status: DC
  Administered 2022-01-02: 1000 mL via INTRAVENOUS

## 2022-01-02 MED ORDER — ONDANSETRON HCL 4 MG/2ML IJ SOLN
INTRAMUSCULAR | Status: DC | PRN
  Administered 2022-01-02: 4 mg via INTRAVENOUS

## 2022-01-03 LAB — SURGICAL PATHOLOGY

## 2022-01-06 ENCOUNTER — Encounter (HOSPITAL_COMMUNITY): Payer: Self-pay | Admitting: Internal Medicine

## 2022-01-18 ENCOUNTER — Other Ambulatory Visit: Payer: Self-pay | Admitting: *Deleted

## 2022-01-18 DIAGNOSIS — K22711 Barrett's esophagus with high grade dysplasia: Secondary | ICD-10-CM

## 2022-01-18 NOTE — Progress Notes (Signed)
Referral placed.

## 2022-01-30 ENCOUNTER — Other Ambulatory Visit: Payer: Self-pay

## 2022-01-30 ENCOUNTER — Emergency Department (HOSPITAL_COMMUNITY): Payer: BC Managed Care – PPO

## 2022-01-30 ENCOUNTER — Emergency Department (HOSPITAL_COMMUNITY)
Admission: EM | Admit: 2022-01-30 | Discharge: 2022-01-31 | Disposition: A | Discharge status: Home / Self Care | Payer: BC Managed Care – PPO | Attending: Emergency Medicine | Admitting: Emergency Medicine

## 2022-01-30 ENCOUNTER — Encounter (HOSPITAL_COMMUNITY): Payer: Self-pay | Admitting: Emergency Medicine

## 2022-01-30 DIAGNOSIS — Z79899 Other long term (current) drug therapy: Secondary | ICD-10-CM | POA: Diagnosis not present

## 2022-01-30 DIAGNOSIS — Z794 Long term (current) use of insulin: Secondary | ICD-10-CM | POA: Diagnosis not present

## 2022-01-30 DIAGNOSIS — E119 Type 2 diabetes mellitus without complications: Secondary | ICD-10-CM | POA: Diagnosis not present

## 2022-01-30 DIAGNOSIS — Z7984 Long term (current) use of oral hypoglycemic drugs: Secondary | ICD-10-CM | POA: Diagnosis not present

## 2022-01-30 DIAGNOSIS — R531 Weakness: Secondary | ICD-10-CM | POA: Diagnosis present

## 2022-01-30 DIAGNOSIS — R202 Paresthesia of skin: Secondary | ICD-10-CM | POA: Diagnosis not present

## 2022-01-30 DIAGNOSIS — I2510 Atherosclerotic heart disease of native coronary artery without angina pectoris: Secondary | ICD-10-CM | POA: Insufficient documentation

## 2022-01-30 DIAGNOSIS — Z7982 Long term (current) use of aspirin: Secondary | ICD-10-CM | POA: Insufficient documentation

## 2022-01-30 DIAGNOSIS — Z8501 Personal history of malignant neoplasm of esophagus: Secondary | ICD-10-CM | POA: Insufficient documentation

## 2022-01-30 DIAGNOSIS — R200 Anesthesia of skin: Secondary | ICD-10-CM | POA: Diagnosis not present

## 2022-01-30 DIAGNOSIS — I10 Essential (primary) hypertension: Secondary | ICD-10-CM | POA: Insufficient documentation

## 2022-01-30 DIAGNOSIS — F17200 Nicotine dependence, unspecified, uncomplicated: Secondary | ICD-10-CM | POA: Insufficient documentation

## 2022-01-30 MED ORDER — DEXAMETHASONE SODIUM PHOSPHATE 10 MG/ML IJ SOLN
10.0000 mg | Freq: Once | INTRAMUSCULAR | Status: AC
Start: 2022-01-30 — End: 2022-01-30
  Administered 2022-01-30: 10 mg via INTRAMUSCULAR
  Filled 2022-01-30: qty 1

## 2022-01-30 NOTE — ED Notes (Signed)
Pt left urine sample in pt's bathroom

## 2022-01-30 NOTE — ED Triage Notes (Signed)
Pt presents with right lower leg numbness that started on yesterday, recently diagnosed with esophageal cancer, PMH of diabetes.

## 2022-01-31 NOTE — ED Provider Notes (Signed)
[facility] Provider Note   CSN: [id number] Arrival date & time: 01/30/22  1810     History  No chief complaint on file.   Alyssa Morales is a 51 y.o. female.  HPI     This is a 51 year old female who presents with 1 day history of worsening right lower leg numbness and weakness.  Patient states that she has noted numbness and tingling over the dorsum of the right foot and the right side of her calf.  She also states that she felt like she was dragging her foot some yesterday.  She at baseline has back pain and denies any new back pain.  She was recently diagnosed with esophageal cancer but has not yet followed up with oncology.  She has an appointment on Wednesday.  Patient has not had any speech, arm weakness, facial symptoms.  Patient does states she has had prior history of sciatica.  Denies bowel or bladder difficulties.  Home Medications Prior to Admission medications   Medication Sig Start Date End Date Taking? Authorizing Provider  albuterol (PROVENTIL HFA;VENTOLIN HFA) 108 (90 BASE) MCG/ACT inhaler Inhale 1 puff into the lungs every 6 (six) hours as needed for wheezing or shortness of breath.    [provider]  aspirin EC 81 MG EC tablet Take 1 tablet (81 mg total) by mouth daily. 11/03/14   Dunn, Nedra Hai, PA-C  buPROPion (WELLBUTRIN XL) 300 MG 24 hr tablet Take 300 mg by mouth every morning. 07/08/21   [provider]  Cholecalciferol (VITAMIN D3) 50 MCG (2000 UT) capsule Take 2,000 Units by mouth daily.    [provider]  guaiFENesin (MUCINEX) 600 MG 12 hr tablet Take 600 mg by mouth daily.    [provider]  hydrOXYzine (ATARAX) 25 MG tablet Take 25-50 mg by mouth See admin instructions. Take 50 mg by mouth at bedtime, may take 25 mg during the day if needed for anxiety 08/05/21   [provider]  irbesartan (AVAPRO) 150 MG tablet Take 1 tablet (150 mg total) by mouth daily. Patient taking differently: Take  150 mg by mouth every evening. 10/25/21   Tanda Rockers, MD  metFORMIN (GLUCOPHAGE) 1000 MG tablet Take 1,000 mg by mouth 2 (two) times daily. 10/13/21   [provider]  metFORMIN (GLUCOPHAGE) 500 MG tablet Take 2 tablets (1,000 mg total) by mouth 2 (two) times daily with a meal. 11/01/17   Nat Christen, MD  Multiple Vitamins-Minerals (WOMENS MULTIVITAMIN PO) Take 1 tablet by mouth daily.    [provider]  naproxen (NAPROSYN) 500 MG tablet Take 500 mg by mouth 2 (two) times daily. 07/08/21   [provider]  nitroGLYCERIN (NITROSTAT) 0.4 MG SL tablet Place 1 tablet (0.4 mg total) under the tongue every 5 (five) minutes as needed for chest pain. 11/01/17   Nat Christen, MD  Omega-3 1000 MG CAPS Take 2,000 mg by mouth daily.    [provider]  omega-3 acid ethyl esters (LOVAZA) 1 G capsule Take 2 capsules (2 g total) by mouth 2 (two) times daily. 11/02/14   Dunn, Nedra Hai, PA-C  omeprazole (PRILOSEC) 40 MG capsule Take 1 capsule (40 mg total) by mouth in the morning and at bedtime. 10/06/21 04/04/22  Eloise Harman, DO  ondansetron (ZOFRAN-ODT) 4 MG disintegrating tablet Take 4 mg by mouth every 8 (eight) hours as needed for vomiting or nausea. 12/11/21   [provider]  oxymetazoline (AFRIN) 0.05 % nasal spray  Place 1 spray into both nostrils 2 (two) times daily as needed for congestion.    [provider]  pravastatin (PRAVACHOL) 40 MG tablet Take 40 mg by mouth daily. 09/16/20   [provider]  Semaglutide (OZEMPIC, 0.25 OR 0.5 MG/DOSE, [place]) Inject 0.5 mg into the skin once a week.    [provider]  TRESIBA FLEXTOUCH 100 UNIT/ML FlexTouch Pen Inject 15 Units into the skin daily. 09/12/21   [provider]      Allergies    Patient has no known allergies.    Review of Systems   Review of Systems  Neurological:  Positive for weakness and numbness.  All other systems reviewed and are negative.   Physical Exam Updated  Vital Signs BP 116/79   Pulse 71   Temp 98.4 F (36.9 C) (Oral)   Resp 16   Ht 1.626 m ('5\' 4"'$ )   Wt 64 kg   LMP 10/01/2016   SpO2 95%   BMI 24.20 kg/m  Physical Exam Vitals and nursing note reviewed.  Constitutional:      Appearance: She is well-developed.     Comments: Chronically ill-appearing but nontoxic  HENT:     Head: Normocephalic and atraumatic.  Eyes:     Pupils: Pupils are equal, round, and reactive to light.  Cardiovascular:     Rate and Rhythm: Normal rate and regular rhythm.     Heart sounds: Normal heart sounds.  Pulmonary:     Effort: Pulmonary effort is normal. No respiratory distress.     Breath sounds: No wheezing.  Abdominal:     Palpations: Abdomen is soft.  Musculoskeletal:     Cervical back: Neck supple.  Skin:    General: Skin is warm and dry.  Neurological:     Mental Status: She is alert and oriented to person, place, and time.     Comments: 5 out of 5 strength in all 4 extremities with exception of dorsiflexion of the right foot which is 4 out of 5  Psychiatric:        Mood and Affect: Mood normal.     ED Results / Procedures / Treatments   Labs (all labs ordered are listed, but only abnormal results are displayed) Labs Reviewed - No data to display  EKG None  Radiology DG Lumbar Spine Complete  Result Date: 01/30/2022 CLINICAL DATA:  Back pain EXAM: LUMBAR SPINE - COMPLETE 4+ VIEW COMPARISON:  None Available. FINDINGS: Mild dextrocurvature. Sagittal alignment within normal limits. Vertebral body heights are maintained. Mild degenerative osteophytes. Mild facet degenerative changes of the lower lumbar spine IMPRESSION: Minimal scoliosis and degenerative change. No acute osseous abnormality Electronically Signed   By: Donavan Foil M.D.   On: 01/30/2022 23:46    Procedures Procedures    Medications Ordered in ED Medications  dexamethasone (DECADRON) injection 10 mg (10 mg Intramuscular Given 01/30/22 2350)    ED Course/ Medical  Decision Making/ A&P Clinical Course as of 01/31/22 0132  Tue Jan 31, 2022  0115 Poke with patient regarding concerns for potential mass lesion or compression causing weakness and numbness.  Given her cancer history, feel she needs MRI. [CH]  0131 Informed by nursing that patient left AGAINST MEDICAL ADVICE.  Was unable to discuss with patient prior to leaving. [CH]    Clinical Course User Index [CH] Craig Ionescu, Barbette Hair, MD  Medical Decision Making Amount and/or Complexity of Data Reviewed Radiology: ordered.  Risk Prescription drug management.   This patient presents to the ED for concern of numbness, weakness, this involves an extensive number of treatment options, and is a complaint that carries with it a high risk of complications and morbidity.  I considered the following differential and admission for this acute, potentially life threatening condition.  The differential diagnosis includes mass lesion in spine, sciatica causing peroneal nerve injury, neuropathy, less likely stroke  MDM:    This is a 51 year old female who presents with weakness and numbness of the right foot and leg.  She is nontoxic and vital signs are largely reassuring.  She has slight weakness with dorsiflexion of the right foot.  Distribution of numbness is consistent with a superficial peroneal nerve.  Would have concern given her recent cancer diagnosis for potential mass lesion of the spine although she does not report any new back pain.  X-rays do not show any lytic lesions.  Discussed with the patient that I felt she should stay for MRI and further work-up for possible nerve compression given objective weakness on exam.  See clinical course above.  Patient left AGAINST MEDICAL ADVICE.  (Labs, imaging, consults)  Labs: I Ordered, and personally interpreted labs.  The pertinent results include: None  Imaging Studies ordered: I ordered imaging studies including x-rays without lytic  lesions I independently visualized and interpreted imaging. I agree with the radiologist interpretation  Additional history obtained from chart review.  External records from outside source obtained and reviewed including prior evaluations  Cardiac Monitoring: The patient was maintained on a cardiac monitor.  I personally viewed and interpreted the cardiac monitored which showed an underlying rhythm of: Normal sinus rhythm  Reevaluation: After the interventions noted above, I reevaluated the patient and found that they have :stayed the same  Social Determinants of Health: Smoker  Disposition: AMA  Co morbidities that complicate the patient evaluation  Past Medical History:  Diagnosis Date   Anxiety    Arthritis    CAD (coronary artery disease)    a. NSTEMI (peak trop 0.06) 10/2014 - cath showing first diagonal with 50-70% ostial narrowing, small in distribution, widely patent LAD/LCx/RCA, EF normal.   Depression    Diabetes mellitus with complication (HCC)    Dyslipidemia    Hypertension    Hypertriglyceridemia    NSTEMI (non-ST elevated myocardial infarction) ([place])    Obesity    Tobacco abuse      Medicines Meds ordered this encounter  Medications   dexamethasone (DECADRON) injection 10 mg    I have reviewed the patients home medicines and have made adjustments as needed  Problem List / ED Course: Problem List Items Addressed This Visit   None               Final Clinical Impression(s) / ED Diagnoses Final diagnoses:  None    Rx / DC Orders ED Discharge Orders     None         Merryl Hacker, MD 01/31/22 0136

## 2022-01-31 NOTE — ED Notes (Signed)
Pt wanted to be discharged. EDP made aware. Pt did not want to wait on discharge papers. Wanted to leave to smoke a cigarette. Was told could not do that. Pt wanted to sign so could leave immediately. Edp made aware

## 2022-01-31 NOTE — ED Notes (Signed)
Patient transported to CT 

## 2022-02-01 ENCOUNTER — Other Ambulatory Visit (HOSPITAL_COMMUNITY): Payer: Self-pay | Admitting: Family Medicine

## 2022-02-01 ENCOUNTER — Ambulatory Visit (HOSPITAL_COMMUNITY)
Admission: RE | Admit: 2022-02-01 | Discharge: 2022-02-01 | Disposition: A | Discharge status: Home / Self Care | Payer: BC Managed Care – PPO | Source: Ambulatory Visit | Attending: Family Medicine | Admitting: Family Medicine

## 2022-02-01 ENCOUNTER — Other Ambulatory Visit: Payer: Self-pay | Admitting: Family Medicine

## 2022-02-01 DIAGNOSIS — R29898 Other symptoms and signs involving the musculoskeletal system: Secondary | ICD-10-CM | POA: Insufficient documentation

## 2022-02-01 DIAGNOSIS — Z8501 Personal history of malignant neoplasm of esophagus: Secondary | ICD-10-CM | POA: Insufficient documentation

## 2022-02-01 MED ORDER — GADOBUTROL 1 MMOL/ML IV SOLN
7.0000 mL | Freq: Once | INTRAVENOUS | Status: AC | PRN
  Administered 2022-02-01: 7 mL via INTRAVENOUS

## 2022-02-13 ENCOUNTER — Ambulatory Visit (HOSPITAL_COMMUNITY)
Admission: RE | Admit: 2022-02-13 | Discharge: 2022-02-13 | Disposition: A | Discharge status: Home / Self Care | Payer: BC Managed Care – PPO | Source: Ambulatory Visit | Attending: Women's Health | Admitting: Women's Health

## 2022-02-13 DIAGNOSIS — Z1231 Encounter for screening mammogram for malignant neoplasm of breast: Secondary | ICD-10-CM | POA: Insufficient documentation

## 2022-03-22 ENCOUNTER — Ambulatory Visit (HOSPITAL_COMMUNITY): Payer: BC Managed Care – PPO | Attending: Family Medicine | Admitting: Physical Therapy

## 2022-03-22 DIAGNOSIS — M5459 Other low back pain: Secondary | ICD-10-CM | POA: Diagnosis present

## 2022-03-22 NOTE — Therapy (Signed)
OUTPATIENT PHYSICAL THERAPY THORACOLUMBAR EVALUATION   Patient Name: Alyssa Morales MRN: [id number] DOB:08/22/1970, 51 y.o., female Today's Date: 03/22/2022   PT End of Session - 03/22/22 1033     Visit Number 1    Number of Visits 12    Date for PT Re-Evaluation 05/03/22    Authorization Type BCBS COMM PPO (no VL, no auth req)    Progress Note Due on Visit 10    PT Start Time [phone]    PT Stop Time 1033    PT Time Calculation (min) 47 min    Activity Tolerance Patient tolerated treatment well    Behavior During Therapy WFL for tasks assessed/performed             Past Medical History:  Diagnosis Date   Anxiety    Arthritis    CAD (coronary artery disease)    a. NSTEMI (peak trop 0.06) 10/2014 - cath showing first diagonal with 50-70% ostial narrowing, small in distribution, widely patent LAD/LCx/RCA, EF normal.   Depression    Diabetes mellitus with complication ([place])    Dyslipidemia    Hypertension    Hypertriglyceridemia    NSTEMI (non-ST elevated myocardial infarction) ([place])    Obesity    Tobacco abuse    Past Surgical History:  Procedure Laterality Date   BIOPSY  10/03/2021   Procedure: BIOPSY;  Surgeon: Eloise Harman, DO;  [facility];  Service: Endoscopy;;   BIOPSY  01/02/2022   Procedure: BIOPSY;  Surgeon: Eloise Harman, DO;  [facility];  Service: Endoscopy;;   CESAREAN SECTION     CHOLECYSTECTOMY     COLONOSCOPY WITH PROPOFOL N/A 10/03/2021   Procedure: COLONOSCOPY WITH PROPOFOL;  Surgeon: Eloise Harman, DO;  [facility];  Service: Endoscopy;  Laterality: N/A;  8:45am   ESOPHAGOGASTRODUODENOSCOPY (EGD) WITH PROPOFOL N/A 10/03/2021   Procedure: ESOPHAGOGASTRODUODENOSCOPY (EGD) WITH PROPOFOL;  Surgeon: Eloise Harman, DO;  [facility];  Service: Endoscopy;  Laterality: N/A;   ESOPHAGOGASTRODUODENOSCOPY (EGD) WITH PROPOFOL N/A 01/02/2022   Procedure: ESOPHAGOGASTRODUODENOSCOPY (EGD) WITH PROPOFOL;   Surgeon: Eloise Harman, DO;  [facility];  Service: Endoscopy;  Laterality: N/A;  7:30am   LEFT HEART CATHETERIZATION WITH CORONARY ANGIOGRAM N/A 11/02/2014   Procedure: LEFT HEART CATHETERIZATION WITH CORONARY ANGIOGRAM;  Surgeon: Belva Crome, MD;  Location: [facility] CATH LAB;  Service: Cardiovascular;  Laterality: N/A;   POLYPECTOMY  10/03/2021   Procedure: POLYPECTOMY INTESTINAL;  Surgeon: Eloise Harman, DO;  [facility];  Service: Endoscopy;;   Patient Active Problem List   Diagnosis Date Noted   COPD  GOLD 0/ active smoker 10/25/2021   Cigarette smoker 10/25/2021   Dysphagia 09/14/2021   Colon cancer screening 09/14/2021   Anogenital warts 08/31/2021   Coronary artery disease 08/31/2021   Gastroesophageal reflux disease 08/31/2021   Major depression single episode, in partial remission ([place]) 08/31/2021   Major depressive disorder 08/31/2021   Stress incontinence (female) (female) 08/31/2021   Type 2 diabetes mellitus ([place]) 08/31/2021   Hypertension 08/31/2021   Hyperlipidemia 08/31/2021   Body mass index (BMI) 30.0-30.9, adult 08/31/2021   Essential hypertension 11/02/2014   Hyposmolality and/or hyponatremia 11/02/2014   Hypertriglyceridemia 11/02/2014   Leukocytosis 11/02/2014   NSTEMI (non-ST elevated myocardial infarction) ([place]) 11/01/2014   Obesity 11/01/2014   Tobacco abuse 11/01/2014   Diabetes mellitus, type 2 ([place]) 11/01/2014    PCP: Eusebio Friendly, DO   REFERRING PROVIDER: Eusebio Friendly,  DO   REFERRING DIAG: PT eval/tx for M48.07 spinal stenosis of lumbosacral region   Rationale for Evaluation and Treatment Rehabilitation  THERAPY DIAG:  Other low back pain  ONSET DATE: Chronic (over 20 years)   SUBJECTIVE:                                                                                                                                                                                           SUBJECTIVE STATEMENT: Patient  presents with complaint of chronic LBP. This has been going on since her 66s. She had recent xrays and MRIs. She reports recent onset of dropfoot in RLE and notes occasionally on LT as well. She denies prior treatment.   PERTINENT HISTORY:  Chronic LBP   PAIN:  Are you having pain? Yes: NPRS scale: 7-8/10 Pain location: low back, Rt hip and leg  Pain description: dull aching, sharp, stabbing  Aggravating factors: sitting, standing, movement of RLE  Relieving factors: laying flat    PRECAUTIONS: None  WEIGHT BEARING RESTRICTIONS No  FALLS:  Has patient fallen in last 6 months? No  LIVING ENVIRONMENT: Lives with: lives with their family and lives with their spouse Lives in: Mobile home Stairs: Yes: External: 7 steps; can reach both Has following equipment at home: None  OCCUPATION: Works at [employer] "Get my foot straight"   OBJECTIVE:   DIAGNOSTIC FINDINGS:  IMPRESSION: 1. No acute osseous findings or evidence of metastatic disease. 2. Moderate multifactorial spinal stenosis at L4-5 secondary to annular disc bulging, facet and ligamentous hypertrophy. There is associated moderate lateral recess and mild foraminal narrowing bilaterally. 3. Asymmetric disc bulging on the right at L3-4 contributes to mild right lateral recess and right foraminal narrowing without definite nerve root encroachment. 4. Chronic asymmetric spondylosis on the left at L5-S1 with progressive moderate to severe left foraminal narrowing and possible chronic left L5 nerve root encroachment.    PATIENT SURVEYS:  FOTO 60% function    COGNITION:  Overall cognitive status: Within functional limits for tasks assessed     SENSATION: WFL  PALPATION: Mod TTP about bilateral lumbar paraspinals   LUMBAR ROM:   Active  A/PROM  eval  Flexion full  Extension 80% limited  Right lateral flexion 50% limited  Left lateral flexion 50% limited  Right rotation   Left  rotation    (Blank rows = not tested)  LOWER EXTREMITY MMT:    MMT Right eval Left eval  Hip flexion 5 5  Hip extension 4 (pain) 4 (pain)  Hip abduction 5 5  Hip adduction    Hip internal  rotation    Hip external rotation    Knee flexion    Knee extension 5 5  Ankle dorsiflexion 4 5  Ankle plantarflexion    Ankle inversion    Ankle eversion     (Blank rows = not tested)  LUMBAR SPECIAL TESTS:  Straight leg raise test: Negative   FUNCTIONAL TESTS:  5 times sit to stand: 11.3 second   TODAY'S TREATMENT  Eval HEP development    PATIENT EDUCATION:  Education details: on eval findings, POC and HEP  Person educated: Patient Education method: Explanation Education comprehension: verbalized understanding   HOME EXERCISE PROGRAM: Access Code: [email or web address] URL: [email or web address] Date: 03/22/2022 Prepared by: Josue Hector  Exercises - Static Prone on Elbows  - 3 x daily - 7 x weekly - 1 sets - 1 reps - 2-3 minutes  hold - Supine Transversus Abdominis Bracing - Hands on Stomach  - 3 x daily - 7 x weekly - 1-2 sets - 10 reps - 5 second hold  ASSESSMENT:  CLINICAL IMPRESSION: Patient is a 51 y.o. female who presents to physical therapy with complaint of LBP. Patient demonstrates muscle weakness, reduced ROM, and fascial restrictions which are likely contributing to symptoms of pain and are negatively impacting patient ability to perform ADLs and functional mobility tasks. Patient will benefit from skilled physical therapy services to address these deficits to reduce pain and improve level of function with ADLs and functional mobility tasks.    OBJECTIVE IMPAIRMENTS Abnormal gait, decreased activity tolerance, decreased balance, decreased mobility, difficulty walking, decreased ROM, decreased strength, increased fascial restrictions, improper body mechanics, and pain.   ACTIVITY LIMITATIONS carrying, lifting, bending, sitting, standing, squatting,  sleeping, transfers, and locomotion level  PARTICIPATION LIMITATIONS: meal prep, cleaning, laundry, driving, shopping, community activity, occupation, and yard work  PERSONAL FACTORS Time since onset of injury/illness/exacerbation are also affecting patient's functional outcome.   REHAB POTENTIAL: Good  CLINICAL DECISION MAKING: Stable/uncomplicated  EVALUATION COMPLEXITY: Low   GOALS: SHORT TERM GOALS: Target date: 04/12/2022  Patient will be independent with initial HEP and self-management strategies to improve functional outcomes Baseline:  Goal status: INITIAL   LONG TERM GOALS: Target date: 05/03/2022  Patient will be independent with advanced HEP and self-management strategies to improve functional outcomes Baseline:  Goal status: INITIAL  2.  Patient will improve FOTO to at least 65% to indicate improvement in functional outcomes Baseline: 60% Goal status: INITIAL  3.  Patient will report reduction of back pain to <3/10 for improved quality of life and ability to perform ADLs  Baseline: 7-8/10 Goal status: INITIAL  4. Patient will have equal to or > 4+/5 MMT throughout BLE to improve ability to perform functional mobility, stair ambulation and ADLs.  Baseline: See MMT Goal status: INITIAL   PLAN: PT FREQUENCY: 1-2x/week  PT DURATION: 6 weeks  PLANNED INTERVENTIONS: Therapeutic exercises, Therapeutic activity, Neuromuscular re-education, Balance training, Gait training, Patient/Family education, Joint manipulation, Joint mobilization, Stair training, Aquatic Therapy, Dry Needling, Electrical stimulation, Spinal manipulation, Spinal mobilization, Cryotherapy, Moist heat, scar mobilization, Taping, Traction, Ultrasound, Biofeedback, Ionotophoresis '4mg'$ /ml Dexamethasone, and Manual therapy. Marland Kitchen  PLAN FOR NEXT SESSION: F/u on extension based exercise form HEP. Progress core strength as tolerated   10:34 AM, 03/22/22 Josue Hector PT DPT  Physical Therapist with [facility]  [phone]

## 2022-03-23 ENCOUNTER — Encounter (HOSPITAL_COMMUNITY): Payer: Self-pay

## 2022-03-23 ENCOUNTER — Ambulatory Visit (HOSPITAL_COMMUNITY): Payer: BC Managed Care – PPO

## 2022-03-23 DIAGNOSIS — M5459 Other low back pain: Secondary | ICD-10-CM | POA: Diagnosis not present

## 2022-03-23 NOTE — Therapy (Signed)
OUTPATIENT PHYSICAL THERAPY THORACOLUMBAR TREATMENT   Patient Name: Alyssa Morales MRN: [id number] DOB:Dec 05, 1970, 51 y.o., female Today's Date: 03/23/2022   PT End of Session - 03/23/22 1106     Visit Number 2    Number of Visits 12    Date for PT Re-Evaluation 05/03/22    Authorization Type BCBS COMM PPO (no VL, no auth req)    Progress Note Due on Visit 10    PT Start Time 1034    PT Stop Time 1115    PT Time Calculation (min) 41 min    Activity Tolerance Patient tolerated treatment well;Patient limited by pain;No increased pain    Behavior During Therapy WFL for tasks assessed/performed              Past Medical History:  Diagnosis Date   Anxiety    Arthritis    CAD (coronary artery disease)    a. NSTEMI (peak trop 0.06) 10/2014 - cath showing first diagonal with 50-70% ostial narrowing, small in distribution, widely patent LAD/LCx/RCA, EF normal.   Depression    Diabetes mellitus with complication ([place])    Dyslipidemia    Hypertension    Hypertriglyceridemia    NSTEMI (non-ST elevated myocardial infarction) ([place])    Obesity    Tobacco abuse    Past Surgical History:  Procedure Laterality Date   BIOPSY  10/03/2021   Procedure: BIOPSY;  Surgeon: Eloise Harman, DO;  [facility];  Service: Endoscopy;;   BIOPSY  01/02/2022   Procedure: BIOPSY;  Surgeon: Eloise Harman, DO;  [facility];  Service: Endoscopy;;   CESAREAN SECTION     CHOLECYSTECTOMY     COLONOSCOPY WITH PROPOFOL N/A 10/03/2021   Procedure: COLONOSCOPY WITH PROPOFOL;  Surgeon: Eloise Harman, DO;  [facility];  Service: Endoscopy;  Laterality: N/A;  8:45am   ESOPHAGOGASTRODUODENOSCOPY (EGD) WITH PROPOFOL N/A 10/03/2021   Procedure: ESOPHAGOGASTRODUODENOSCOPY (EGD) WITH PROPOFOL;  Surgeon: Eloise Harman, DO;  [facility];  Service: Endoscopy;  Laterality: N/A;   ESOPHAGOGASTRODUODENOSCOPY (EGD) WITH PROPOFOL N/A 01/02/2022   Procedure:  ESOPHAGOGASTRODUODENOSCOPY (EGD) WITH PROPOFOL;  Surgeon: Eloise Harman, DO;  [facility];  Service: Endoscopy;  Laterality: N/A;  7:30am   LEFT HEART CATHETERIZATION WITH CORONARY ANGIOGRAM N/A 11/02/2014   Procedure: LEFT HEART CATHETERIZATION WITH CORONARY ANGIOGRAM;  Surgeon: Belva Crome, MD;  [facility]: [facility] CATH LAB;  Service: Cardiovascular;  Laterality: N/A;   POLYPECTOMY  10/03/2021   Procedure: POLYPECTOMY INTESTINAL;  Surgeon: Eloise Harman, DO;  [facility];  Service: Endoscopy;;   Patient Active Problem List   Diagnosis Date Noted   COPD  GOLD 0/ active smoker 10/25/2021   Cigarette smoker 10/25/2021   Dysphagia 09/14/2021   Colon cancer screening 09/14/2021   Anogenital warts 08/31/2021   Coronary artery disease 08/31/2021   Gastroesophageal reflux disease 08/31/2021   Major depression single episode, in partial remission ([place]) 08/31/2021   Major depressive disorder 08/31/2021   Stress incontinence (female) (female) 08/31/2021   Type 2 diabetes mellitus ([place]) 08/31/2021   Hypertension 08/31/2021   Hyperlipidemia 08/31/2021   Body mass index (BMI) 30.0-30.9, adult 08/31/2021   Essential hypertension 11/02/2014   Hyposmolality and/or hyponatremia 11/02/2014   Hypertriglyceridemia 11/02/2014   Leukocytosis 11/02/2014   NSTEMI (non-ST elevated myocardial infarction) ([place]) 11/01/2014   Obesity 11/01/2014   Tobacco abuse 11/01/2014   Diabetes mellitus, type 2 ([place]) 11/01/2014    PCP: Eusebio Friendly, DO  REFERRING PROVIDER: Eusebio Friendly, DO   REFERRING DIAG: PT eval/tx for M48.07 spinal stenosis of lumbosacral region   Rationale for Evaluation and Treatment Rehabilitation  THERAPY DIAG:  Other low back pain  ONSET DATE: Chronic (over 20 years)   SUBJECTIVE:                                                                                                                                                                                            SUBJECTIVE STATEMENT: 03/23/22:  Reports increased hip and LBP following exercises last night, reports increased ache Lt hip today.  Feels like a pinch in back during prone exercises.  Has apt with 04/05/22 with spine specialist.  Eval subjective:  Patient presents with complaint of chronic LBP. This has been going on since her 51s. She had recent xrays and MRIs. She reports recent onset of dropfoot in RLE and notes occasionally on LT as well. She denies prior treatment.   PERTINENT HISTORY:  Chronic LBP   PAIN:  Are you having pain? Yes: NPRS scale: 7/10 Pain location: low back, Rt hip and leg  Pain description: dull aching, sharp, stabbing  Aggravating factors: sitting, standing, movement of RLE  Relieving factors: laying flat    PRECAUTIONS: None  WEIGHT BEARING RESTRICTIONS No  FALLS:  Has patient fallen in last 6 months? No  LIVING ENVIRONMENT: Lives with: lives with their family and lives with their spouse Lives in: Mobile home Stairs: Yes: External: 7 steps; can reach both Has following equipment at home: None  OCCUPATION: Works at [employer] "Get my foot straight"   OBJECTIVE:   DIAGNOSTIC FINDINGS:  IMPRESSION: 1. No acute osseous findings or evidence of metastatic disease. 2. Moderate multifactorial spinal stenosis at L4-5 secondary to annular disc bulging, facet and ligamentous hypertrophy. There is associated moderate lateral recess and mild foraminal narrowing bilaterally. 3. Asymmetric disc bulging on the right at L3-4 contributes to mild right lateral recess and right foraminal narrowing without definite nerve root encroachment. 4. Chronic asymmetric spondylosis on the left at L5-S1 with progressive moderate to severe left foraminal narrowing and possible chronic left L5 nerve root encroachment.    PATIENT SURVEYS:  FOTO 60% function    COGNITION:  Overall cognitive status: Within functional limits for tasks  assessed     SENSATION: WFL  PALPATION: Mod TTP about bilateral lumbar paraspinals   LUMBAR ROM:   Active  A/PROM  eval  Flexion full  Extension 80% limited  Right lateral flexion 50% limited  Left lateral flexion 50% limited  Right rotation   Left rotation    (  Blank rows = not tested)  LOWER EXTREMITY MMT:    MMT Right eval Left eval  Hip flexion 5 5  Hip extension 4 (pain) 4 (pain)  Hip abduction 5 5  Hip adduction    Hip internal rotation    Hip external rotation    Knee flexion    Knee extension 5 5  Ankle dorsiflexion 4 5  Ankle plantarflexion    Ankle inversion    Ankle eversion     (Blank rows = not tested)  LUMBAR SPECIAL TESTS:  Straight leg raise test: Negative   FUNCTIONAL TESTS:  5 times sit to stand: 11.3 second   TODAY'S TREATMENT  03/23/22: Reviewed goals, reviewed HEP compliance POE with 2 pillow under hips for pain control Supine Deep breathing into stomach, exhale through mouth x 2 min to reduce accessory mm  Abdominal sets inhale through nose, exhale through mouth with exertion  Bent knee lift with ab sets  Bridge partial range WBOS with heel press 10x 3" Log rolling  Eval HEP development    PATIENT EDUCATION:  Education details: on eval findings, POC and HEP  Person educated: Patient Education method: Explanation Education comprehension: verbalized understanding   HOME EXERCISE PROGRAM: Access Code: [email or web address] URL: [email or web address] Date: 03/22/2022 Prepared by: Josue Hector  Exercises - Static Prone on Elbows  - 3 x daily - 7 x weekly - 1 sets - 1 reps - 2-3 minutes  hold - Supine Transversus Abdominis Bracing - Hands on Stomach  - 3 x daily - 7 x weekly - 1-2 sets - 10 reps - 5 second hold  03/23/22: bent knee raise with ab set  2 pillow under hips with POE  ASSESSMENT:  CLINICAL IMPRESSION: Reviewed goals, educated importance of HEP compliance for maximal benefits, pt reports increased pain with  POE.  Reviewed current exercise program with some modification including additional pillows under hips for lumbar extension which was tolerated better and pairing abdominal bracing with exhalation to continue breathing.  EOS pt presents with improved breathing and less accessory mm activation and reports of pain reduced though does continue to have LBP.   OBJECTIVE IMPAIRMENTS Abnormal gait, decreased activity tolerance, decreased balance, decreased mobility, difficulty walking, decreased ROM, decreased strength, increased fascial restrictions, improper body mechanics, and pain.   ACTIVITY LIMITATIONS carrying, lifting, bending, sitting, standing, squatting, sleeping, transfers, and locomotion level  PARTICIPATION LIMITATIONS: meal prep, cleaning, laundry, driving, shopping, community activity, occupation, and yard work  PERSONAL FACTORS Time since onset of injury/illness/exacerbation are also affecting patient's functional outcome.   REHAB POTENTIAL: Good  CLINICAL DECISION MAKING: Stable/uncomplicated  EVALUATION COMPLEXITY: Low   GOALS: SHORT TERM GOALS: Target date: 04/12/2022  Patient will be independent with initial HEP and self-management strategies to improve functional outcomes Baseline:  Goal status: IN PROGRESS   LONG TERM GOALS: Target date: 05/03/2022  Patient will be independent with advanced HEP and self-management strategies to improve functional outcomes Baseline:  Goal status: IN PROGRESS  2.  Patient will improve FOTO to at least 65% to indicate improvement in functional outcomes Baseline: 60% Goal status: IN PROGRESS  3.  Patient will report reduction of back pain to <3/10 for improved quality of life and ability to perform ADLs  Baseline: 7-8/10 Goal status: IN PROGRESS  4. Patient will have equal to or > 4+/5 MMT throughout BLE to improve ability to perform functional mobility, stair ambulation and ADLs.  Baseline: See MMT Goal status: IN  PROGRESS   PLAN: PT FREQUENCY: 1-2x/week  PT DURATION: 6 weeks  PLANNED INTERVENTIONS: Therapeutic exercises, Therapeutic activity, Neuromuscular re-education, Balance training, Gait training, Patient/Family education, Joint manipulation, Joint mobilization, Stair training, Aquatic Therapy, Dry Needling, Electrical stimulation, Spinal manipulation, Spinal mobilization, Cryotherapy, Moist heat, scar mobilization, Taping, Traction, Ultrasound, Biofeedback, Ionotophoresis '4mg'$ /ml Dexamethasone, and Manual therapy. Marland Kitchen  PLAN FOR NEXT SESSION: F/u on extension based exercise form HEP. Progress core strength as tolerated.  Add proximal and postural strengthening, mobility in pain free range.  Ihor Austin, LPTA/CLT; CBIS [phone]  2:54 PM, 03/23/22

## 2022-03-30 ENCOUNTER — Ambulatory Visit (HOSPITAL_COMMUNITY): Payer: BC Managed Care – PPO | Admitting: Physical Therapy

## 2022-03-30 DIAGNOSIS — M5459 Other low back pain: Secondary | ICD-10-CM | POA: Diagnosis not present

## 2022-03-30 NOTE — Therapy (Signed)
OUTPATIENT PHYSICAL THERAPY THORACOLUMBAR TREATMENT   Patient Name: Alyssa Morales MRN: [id number] DOB:[birth year], 51 y.o., female Today's Date: 03/30/2022   PT End of Session - 03/30/22 1030     Visit Number 3    Number of Visits 12    Date for PT Re-Evaluation 05/03/22    Authorization Type BCBS COMM PPO (no VL, no auth req)    Progress Note Due on Visit 10    PT Start Time 1034    PT Stop Time 1112    PT Time Calculation (min) 38 min    Activity Tolerance Patient tolerated treatment well    Behavior During Therapy WFL for tasks assessed/performed              Past Medical History:  Diagnosis Date   Anxiety    Arthritis    CAD (coronary artery disease)    a. NSTEMI (peak trop 0.06) 10/2014 - cath showing first diagonal with 50-70% ostial narrowing, small in distribution, widely patent LAD/LCx/RCA, EF normal.   Depression    Diabetes mellitus with complication ([place])    Dyslipidemia    Hypertension    Hypertriglyceridemia    NSTEMI (non-ST elevated myocardial infarction) ([place])    Obesity    Tobacco abuse    Past Surgical History:  Procedure Laterality Date   BIOPSY  10/03/2021   Procedure: BIOPSY;  Surgeon: Eloise Harman, DO;  [facility];  Service: Endoscopy;;   BIOPSY  01/02/2022   Procedure: BIOPSY;  Surgeon: Eloise Harman, DO;  [facility];  Service: Endoscopy;;   CESAREAN SECTION     CHOLECYSTECTOMY     COLONOSCOPY WITH PROPOFOL N/A 10/03/2021   Procedure: COLONOSCOPY WITH PROPOFOL;  Surgeon: Eloise Harman, DO;  [facility];  Service: Endoscopy;  Laterality: N/A;  8:45am   ESOPHAGOGASTRODUODENOSCOPY (EGD) WITH PROPOFOL N/A 10/03/2021   Procedure: ESOPHAGOGASTRODUODENOSCOPY (EGD) WITH PROPOFOL;  Surgeon: Eloise Harman, DO;  [facility];  Service: Endoscopy;  Laterality: N/A;   ESOPHAGOGASTRODUODENOSCOPY (EGD) WITH PROPOFOL N/A 01/02/2022   Procedure: ESOPHAGOGASTRODUODENOSCOPY (EGD) WITH PROPOFOL;   Surgeon: Eloise Harman, DO;  [facility];  Service: Endoscopy;  Laterality: N/A;  7:30am   LEFT HEART CATHETERIZATION WITH CORONARY ANGIOGRAM N/A 11/02/2014   Procedure: LEFT HEART CATHETERIZATION WITH CORONARY ANGIOGRAM;  Surgeon: Belva Crome, MD;  [facility] CATH LAB;  Service: Cardiovascular;  Laterality: N/A;   POLYPECTOMY  10/03/2021   Procedure: POLYPECTOMY INTESTINAL;  Surgeon: Eloise Harman, DO;  [facility];  Service: Endoscopy;;   Patient Active Problem List   Diagnosis Date Noted   COPD  GOLD 0/ active smoker 10/25/2021   Cigarette smoker 10/25/2021   Dysphagia 09/14/2021   Colon cancer screening 09/14/2021   Anogenital warts 08/31/2021   Coronary artery disease 08/31/2021   Gastroesophageal reflux disease 08/31/2021   Major depression single episode, in partial remission ([place]) 08/31/2021   Major depressive disorder 08/31/2021   Stress incontinence (female) (female) 08/31/2021   Type 2 diabetes mellitus ([place]) 08/31/2021   Hypertension 08/31/2021   Hyperlipidemia 08/31/2021   Body mass index (BMI) 30.0-30.9, adult 08/31/2021   Essential hypertension 11/02/2014   Hyposmolality and/or hyponatremia 11/02/2014   Hypertriglyceridemia 11/02/2014   Leukocytosis 11/02/2014   NSTEMI (non-ST elevated myocardial infarction) ([place]) 11/01/2014   Obesity 11/01/2014   Tobacco abuse 11/01/2014   Diabetes mellitus, type 2 ([place]) 11/01/2014    PCP: Eusebio Friendly, DO   REFERRING PROVIDER: Gerarda Fraction  Mullis, DO   REFERRING DIAG: PT eval/tx for M48.07 spinal stenosis of lumbosacral region   Rationale for Evaluation and Treatment Rehabilitation  THERAPY DIAG:  Other low back pain  ONSET DATE: Chronic (over 20 years)   SUBJECTIVE:                                                                                                                                                                                           SUBJECTIVE STATEMENT: Doing  "Alright". Been busy lately. Lots of appointments lately, feeling tired today. Prone press ups are tough getting full extension.   Eval subjective:  Patient presents with complaint of chronic LBP. This has been going on since her 71s. She had recent xrays and MRIs. She reports recent onset of dropfoot in RLE and notes occasionally on LT as well. She denies prior treatment.   PERTINENT HISTORY:  Chronic LBP   PAIN:  Are you having pain? Yes: NPRS scale: 7/10 Pain location: low back, Rt hip and leg  Pain description: dull aching, sharp, stabbing  Aggravating factors: sitting, standing, movement of RLE  Relieving factors: laying flat    PRECAUTIONS: None  WEIGHT BEARING RESTRICTIONS No  FALLS:  Has patient fallen in last 6 months? No  LIVING ENVIRONMENT: Lives with: lives with their family and lives with their spouse Lives in: Mobile home Stairs: Yes: External: 7 steps; can reach both Has following equipment at home: None  OCCUPATION: Works at [employer]: [place] "Get my foot straight"   OBJECTIVE:   DIAGNOSTIC FINDINGS:  IMPRESSION: 1. No acute osseous findings or evidence of metastatic disease. 2. Moderate multifactorial spinal stenosis at L4-5 secondary to annular disc bulging, facet and ligamentous hypertrophy. There is associated moderate lateral recess and mild foraminal narrowing bilaterally. 3. Asymmetric disc bulging on the right at L3-4 contributes to mild right lateral recess and right foraminal narrowing without definite nerve root encroachment. 4. Chronic asymmetric spondylosis on the left at L5-S1 with progressive moderate to severe left foraminal narrowing and possible chronic left L5 nerve root encroachment.    PATIENT SURVEYS:  FOTO 60% function    COGNITION:  Overall cognitive status: Within functional limits for tasks assessed     SENSATION: WFL  PALPATION: Mod TTP about bilateral lumbar paraspinals   LUMBAR ROM:    Active  A/PROM  eval  Flexion full  Extension 80% limited  Right lateral flexion 50% limited  Left lateral flexion 50% limited  Right rotation   Left rotation    (Blank rows = not tested)  LOWER EXTREMITY MMT:    MMT Right eval Left  eval  Hip flexion 5 5  Hip extension 4 (pain) 4 (pain)  Hip abduction 5 5  Hip adduction    Hip internal rotation    Hip external rotation    Knee flexion    Knee extension 5 5  Ankle dorsiflexion 4 5  Ankle plantarflexion    Ankle inversion    Ankle eversion     (Blank rows = not tested)  LUMBAR SPECIAL TESTS:  Straight leg raise test: Negative   FUNCTIONAL TESTS:  5 times sit to stand: 11.3 second   TODAY'S TREATMENT  03/30/22 POE 3 min (decreased pain from 7 to 6/10) Ab brace 10 x 5" AB march 2 x 10 Clamshell 2 x10  Bridge (partial range) 2 x 10 Supine hip adduction 15 x 5"  03/23/22: Reviewed goals, reviewed HEP compliance POE with 2 pillow under hips for pain control Supine Deep breathing into stomach, exhale through mouth x 2 min to reduce accessory mm  Abdominal sets inhale through nose, exhale through mouth with exertion  Bent knee lift with ab sets  Bridge partial range WBOS with heel press 10x 3" Log rolling  Eval HEP development    PATIENT EDUCATION:  Education details: on eval findings, POC and HEP  Person educated: Patient Education method: Explanation Education comprehension: verbalized understanding   HOME EXERCISE PROGRAM:  Access Code: [email or web address] URL: [email or web address]  Date: 03/30/2022 Prepared by: Josue Hector  Exercises - Static Prone on Elbows  - 2-3 x daily - 7 x weekly - 1 sets - 1 reps - 2-3 minutes  hold - Supine Transversus Abdominis Bracing - Hands on Stomach  - 2-3 x daily - 7 x weekly - 1-2 sets - 10 reps - 5 second hold - Supine March  - 2-3 x daily - 7 x weekly - 1 sets - 10 reps - Clamshell  - 2-3 x daily - 7 x weekly - 1 sets - 10 reps - 3-5 second hold -  Supine Hip Adduction Isometric with Ball  - 2-3 x daily - 7 x weekly - 1 sets - 10 reps - 3-5 second hold - Supine Bridge  - 2-3 x daily - 7 x weekly - 1 sets - 10 reps - 3-5 second hold URL: [email or web address] Date: 03/22/2022 Prepared by: Josue Hector  Exercises - Static Prone on Elbows  - 3 x daily - 7 x weekly - 1 sets - 1 reps - 2-3 minutes  hold - Supine Transversus Abdominis Bracing - Hands on Stomach  - 3 x daily - 7 x weekly - 1-2 sets - 10 reps - 5 second hold  03/23/22: bent knee raise with ab set  2 pillow under hips with POE  ASSESSMENT:  CLINICAL IMPRESSION: Patient tolerated session well today. Continued with established POC for hip and core strengthening to support lumbar spine and decrease hip and LBP. Patient notes slight reduction in pain with prone on elbow stretching. Educated on purpose and proper form with all added exercises. Added to HEP and issued handout. Patient will continue to benefit from skilled therapy services to reduce remaining deficits and improve functional ability.    OBJECTIVE IMPAIRMENTS Abnormal gait, decreased activity tolerance, decreased balance, decreased mobility, difficulty walking, decreased ROM, decreased strength, increased fascial restrictions, improper body mechanics, and pain.   ACTIVITY LIMITATIONS carrying, lifting, bending, sitting, standing, squatting, sleeping, transfers, and locomotion level  PARTICIPATION LIMITATIONS: meal prep, cleaning, laundry, driving, shopping, community activity, occupation, and yard work  PERSONAL FACTORS Time since onset  of injury/illness/exacerbation are also affecting patient's functional outcome.   REHAB POTENTIAL: Good  CLINICAL DECISION MAKING: Stable/uncomplicated  EVALUATION COMPLEXITY: Low   GOALS: SHORT TERM GOALS: Target date: 04/12/2022  Patient will be independent with initial HEP and self-management strategies to improve functional outcomes Baseline:  Goal  status: IN PROGRESS   LONG TERM GOALS: Target date: 05/03/2022  Patient will be independent with advanced HEP and self-management strategies to improve functional outcomes Baseline:  Goal status: IN PROGRESS  2.  Patient will improve FOTO to at least 65% to indicate improvement in functional outcomes Baseline: 60% Goal status: IN PROGRESS  3.  Patient will report reduction of back pain to <3/10 for improved quality of life and ability to perform ADLs  Baseline: 7-8/10 Goal status: IN PROGRESS  4. Patient will have equal to or > 4+/5 MMT throughout BLE to improve ability to perform functional mobility, stair ambulation and ADLs.  Baseline: See MMT Goal status: IN PROGRESS   PLAN: PT FREQUENCY: 1-2x/week  PT DURATION: 6 weeks  PLANNED INTERVENTIONS: Therapeutic exercises, Therapeutic activity, Neuromuscular re-education, Balance training, Gait training, Patient/Family education, Joint manipulation, Joint mobilization, Stair training, Aquatic Therapy, Dry Needling, Electrical stimulation, Spinal manipulation, Spinal mobilization, Cryotherapy, Moist heat, scar mobilization, Taping, Traction, Ultrasound, Biofeedback, Ionotophoresis '4mg'$ /ml Dexamethasone, and Manual therapy. Marland Kitchen  PLAN FOR NEXT SESSION: Progress core strength as tolerated.  Progress proximal and postural strengthening, mobility in pain free range.  10:37 AM, 03/30/22 Josue Hector PT DPT  Physical Therapist with [facility]  [phone]

## 2022-03-31 ENCOUNTER — Encounter (HOSPITAL_COMMUNITY): Payer: BC Managed Care – PPO | Admitting: Physical Therapy

## 2022-04-04 ENCOUNTER — Ambulatory Visit (HOSPITAL_COMMUNITY): Payer: BC Managed Care – PPO | Admitting: Physical Therapy

## 2022-04-05 ENCOUNTER — Other Ambulatory Visit (HOSPITAL_COMMUNITY): Payer: Self-pay | Admitting: Orthopedic Surgery

## 2022-04-05 DIAGNOSIS — R269 Unspecified abnormalities of gait and mobility: Secondary | ICD-10-CM

## 2022-04-06 ENCOUNTER — Encounter (HOSPITAL_COMMUNITY): Payer: BC Managed Care – PPO | Admitting: Physical Therapy

## 2022-04-11 ENCOUNTER — Ambulatory Visit (HOSPITAL_COMMUNITY): Payer: BC Managed Care – PPO | Admitting: Physical Therapy

## 2022-04-12 ENCOUNTER — Encounter (HOSPITAL_COMMUNITY): Payer: BC Managed Care – PPO | Admitting: Physical Therapy

## 2022-04-17 ENCOUNTER — Ambulatory Visit (HOSPITAL_COMMUNITY)
Admission: RE | Admit: 2022-04-17 | Discharge: 2022-04-17 | Disposition: A | Discharge status: Home / Self Care | Payer: BC Managed Care – PPO | Source: Ambulatory Visit | Attending: Orthopedic Surgery | Admitting: Orthopedic Surgery

## 2022-04-17 DIAGNOSIS — R269 Unspecified abnormalities of gait and mobility: Secondary | ICD-10-CM | POA: Insufficient documentation

## 2022-04-18 ENCOUNTER — Encounter (HOSPITAL_COMMUNITY): Payer: Self-pay | Admitting: Physical Therapy

## 2022-04-18 ENCOUNTER — Ambulatory Visit (HOSPITAL_COMMUNITY): Payer: BC Managed Care – PPO | Attending: Family Medicine | Admitting: Physical Therapy

## 2022-04-18 DIAGNOSIS — M5459 Other low back pain: Secondary | ICD-10-CM | POA: Diagnosis present

## 2022-04-18 DIAGNOSIS — M4807 Spinal stenosis, lumbosacral region: Secondary | ICD-10-CM | POA: Diagnosis not present

## 2022-04-18 NOTE — Therapy (Signed)
OUTPATIENT PHYSICAL THERAPY THORACOLUMBAR TREATMENT   Patient Name: Alyssa Morales MRN: [id number] DOB:Oct 27, 1970, 51 y.o., female Today's Date: 04/18/2022   PT End of Session - 04/18/22 1308     Visit Number 4    Number of Visits 12    Date for PT Re-Evaluation 05/03/22    Authorization Type BCBS COMM PPO (no VL, no auth req)    Progress Note Due on Visit 10    PT Start Time 8921    PT Stop Time 1343    PT Time Calculation (min) 38 min    Activity Tolerance Patient tolerated treatment well    Behavior During Therapy WFL for tasks assessed/performed              Past Medical History:  Diagnosis Date   Anxiety    Arthritis    CAD (coronary artery disease)    a. NSTEMI (peak trop 0.06) 10/2014 - cath showing first diagonal with 50-70% ostial narrowing, small in distribution, widely patent LAD/LCx/RCA, EF normal.   Depression    Diabetes mellitus with complication ([place])    Dyslipidemia    Hypertension    Hypertriglyceridemia    NSTEMI (non-ST elevated myocardial infarction) ([place])    Obesity    Tobacco abuse    Past Surgical History:  Procedure Laterality Date   BIOPSY  10/03/2021   Procedure: BIOPSY;  Surgeon: Eloise Harman, DO;  [facility];  Service: Endoscopy;;   BIOPSY  01/02/2022   Procedure: BIOPSY;  Surgeon: Eloise Harman, DO;  [facility];  Service: Endoscopy;;   CESAREAN SECTION     CHOLECYSTECTOMY     COLONOSCOPY WITH PROPOFOL N/A 10/03/2021   Procedure: COLONOSCOPY WITH PROPOFOL;  Surgeon: Eloise Harman, DO;  [facility];  Service: Endoscopy;  Laterality: N/A;  8:45am   ESOPHAGOGASTRODUODENOSCOPY (EGD) WITH PROPOFOL N/A 10/03/2021   Procedure: ESOPHAGOGASTRODUODENOSCOPY (EGD) WITH PROPOFOL;  Surgeon: Eloise Harman, DO;  [facility];  Service: Endoscopy;  Laterality: N/A;   ESOPHAGOGASTRODUODENOSCOPY (EGD) WITH PROPOFOL N/A 01/02/2022   Procedure: ESOPHAGOGASTRODUODENOSCOPY (EGD) WITH PROPOFOL;   Surgeon: Eloise Harman, DO;  [facility];  Service: Endoscopy;  Laterality: N/A;  7:30am   LEFT HEART CATHETERIZATION WITH CORONARY ANGIOGRAM N/A 11/02/2014   Procedure: LEFT HEART CATHETERIZATION WITH CORONARY ANGIOGRAM;  Surgeon: Belva Crome, MD;  Location: [facility] CATH LAB;  Service: Cardiovascular;  Laterality: N/A;   POLYPECTOMY  10/03/2021   Procedure: POLYPECTOMY INTESTINAL;  Surgeon: Eloise Harman, DO;  [facility];  Service: Endoscopy;;   Patient Active Problem List   Diagnosis Date Noted   COPD  GOLD 0/ active smoker 10/25/2021   Cigarette smoker 10/25/2021   Dysphagia 09/14/2021   Colon cancer screening 09/14/2021   Anogenital warts 08/31/2021   Coronary artery disease 08/31/2021   Gastroesophageal reflux disease 08/31/2021   Major depression single episode, in partial remission ([place]) 08/31/2021   Major depressive disorder 08/31/2021   Stress incontinence (female) (female) 08/31/2021   Type 2 diabetes mellitus ([place]) 08/31/2021   Hypertension 08/31/2021   Hyperlipidemia 08/31/2021   Body mass index (BMI) 30.0-30.9, adult 08/31/2021   Essential hypertension 11/02/2014   Hyposmolality and/or hyponatremia 11/02/2014   Hypertriglyceridemia 11/02/2014   Leukocytosis 11/02/2014   NSTEMI (non-ST elevated myocardial infarction) ([place]) 11/01/2014   Obesity 11/01/2014   Tobacco abuse 11/01/2014   Diabetes mellitus, type 2 ([place]) 11/01/2014    PCP: Eusebio Friendly, DO   REFERRING PROVIDER: Gerarda Fraction  Mullis, DO   REFERRING DIAG: PT eval/tx for M48.07 spinal stenosis of lumbosacral region   Rationale for Evaluation and Treatment Rehabilitation  THERAPY DIAG:  No diagnosis found.  ONSET DATE: Chronic (over 20 years)   SUBJECTIVE:                                                                                                                                                                                           SUBJECTIVE STATEMENT: Having trouble  with balance. She fell 2 weeks ago at work and scraped her knee. Trying to work on HEP but "not like I should".   Eval subjective:  Patient presents with complaint of chronic LBP. This has been going on since her 37s. She had recent xrays and MRIs. She reports recent onset of dropfoot in RLE and notes occasionally on LT as well. She denies prior treatment.   PERTINENT HISTORY:  Chronic LBP   PAIN:  Are you having pain? Yes: NPRS scale: 7/10 Pain location: low back, LT hip   Pain description: dull aching, sharp, stabbing  Aggravating factors: sitting, standing, movement of RLE  Relieving factors: laying flat    PRECAUTIONS: None  WEIGHT BEARING RESTRICTIONS No  FALLS:  Has patient fallen in last 6 months? No  LIVING ENVIRONMENT: Lives with: lives with their family and lives with their spouse Lives in: Mobile home Stairs: Yes: External: 7 steps; can reach both Has following equipment at home: None  OCCUPATION: Works at [employer]: [place] "Get my foot straight"   OBJECTIVE:   DIAGNOSTIC FINDINGS:  IMPRESSION: 1. No acute osseous findings or evidence of metastatic disease. 2. Moderate multifactorial spinal stenosis at L4-5 secondary to annular disc bulging, facet and ligamentous hypertrophy. There is associated moderate lateral recess and mild foraminal narrowing bilaterally. 3. Asymmetric disc bulging on the right at L3-4 contributes to mild right lateral recess and right foraminal narrowing without definite nerve root encroachment. 4. Chronic asymmetric spondylosis on the left at L5-S1 with progressive moderate to severe left foraminal narrowing and possible chronic left L5 nerve root encroachment.    PATIENT SURVEYS:  FOTO 60% function    COGNITION:  Overall cognitive status: Within functional limits for tasks assessed     SENSATION: WFL  PALPATION: Mod TTP about bilateral lumbar paraspinals   LUMBAR ROM:   Active  A/PROM  eval   Flexion full  Extension 80% limited  Right lateral flexion 50% limited  Left lateral flexion 50% limited  Right rotation   Left rotation    (Blank rows = not tested)  LOWER EXTREMITY MMT:    MMT  Right eval Left eval  Hip flexion 5 5  Hip extension 4 (pain) 4 (pain)  Hip abduction 5 5  Hip adduction    Hip internal rotation    Hip external rotation    Knee flexion    Knee extension 5 5  Ankle dorsiflexion 4 5  Ankle plantarflexion    Ankle inversion    Ankle eversion     (Blank rows = not tested)  LUMBAR SPECIAL TESTS:  Straight leg raise test: Negative   FUNCTIONAL TESTS:  5 times sit to stand: 11.3 second   TODAY'S TREATMENT  04/18/22 Heel raise 2 x 10 Toe raise 2 x 10 Tandem stance 2 x 30" Step taps 6 inch x20  Step ups 6 inch x10 Power ups 6 inch x10 each  Mini squat with RTB at knees 2 x 10 Standing hip abduction RTB x20  Standing hip extension RTB x20  03/30/22 POE 3 min (decreased pain from 7 to 6/10) Ab brace 10 x 5" AB march 2 x 10 Clamshell 2 x10  Bridge (partial range) 2 x 10 Supine hip adduction 15 x 5"  03/23/22: Reviewed goals, reviewed HEP compliance POE with 2 pillow under hips for pain control Supine Deep breathing into stomach, exhale through mouth x 2 min to reduce accessory mm  Abdominal sets inhale through nose, exhale through mouth with exertion  Bent knee lift with ab sets  Bridge partial range WBOS with heel press 10x 3" Log rolling  Eval HEP development    PATIENT EDUCATION:  Education details: on eval findings, POC and HEP  Person educated: Patient Education method: Explanation Education comprehension: verbalized understanding   HOME EXERCISE PROGRAM:  Access Code: [email or web address] URL: [email or web address]  04/18/22 - Standing Heel Raise with Support  - 2 x daily - 7 x weekly - 2 sets - 10 reps - Standing Toe Raises at Chair  - 2 x daily - 7 x weekly - 2 sets - 10 reps - Hip Abduction with Resistance  Loop  - 2 x daily - 7 x weekly - 2 sets - 10 reps - Hip Extension with Resistance Loop  - 2 x daily - 7 x weekly - 2 sets - 10 reps - Tandem Stance with Support  - 2 x daily - 7 x weekly - 1 sets - 3 reps - 30 second hold  Date: 03/30/2022 Prepared by: Josue Hector  Exercises - Static Prone on Elbows  - 2-3 x daily - 7 x weekly - 1 sets - 1 reps - 2-3 minutes  hold - Supine Transversus Abdominis Bracing - Hands on Stomach  - 2-3 x daily - 7 x weekly - 1-2 sets - 10 reps - 5 second hold - Supine March  - 2-3 x daily - 7 x weekly - 1 sets - 10 reps - Clamshell  - 2-3 x daily - 7 x weekly - 1 sets - 10 reps - 3-5 second hold - Supine Hip Adduction Isometric with Ball  - 2-3 x daily - 7 x weekly - 1 sets - 10 reps - 3-5 second hold - Supine Bridge  - 2-3 x daily - 7 x weekly - 1 sets - 10 reps - 3-5 second hold URL: [email or web address] Date: 03/22/2022 Prepared by: Josue Hector  Exercises - Static Prone on Elbows  - 3 x daily - 7 x weekly - 1 sets - 1 reps - 2-3 minutes  hold - Supine Transversus Abdominis Bracing - Hands on Stomach  -  3 x daily - 7 x weekly - 1-2 sets - 10 reps - 5 second hold  03/23/22: bent knee raise with ab set  2 pillow under hips with POE  ASSESSMENT:  CLINICAL IMPRESSION: Patient tolerated session well. Progressed LE strengthening with added band hip abduction and extension. Also incorporated balance activity to address issues with balance and recent fall. Patient did well with these but does show noted weakness and difficulty with balance on RLE. Was challenged with mini squatting, requiring frequent cues for form and body mechanics. Patient educated on and issued updated HEP handout. Patient will continue to benefit from skilled therapy services to reduce remaining deficits and improve functional ability.    OBJECTIVE IMPAIRMENTS Abnormal gait, decreased activity tolerance, decreased balance, decreased mobility, difficulty walking, decreased  ROM, decreased strength, increased fascial restrictions, improper body mechanics, and pain.   ACTIVITY LIMITATIONS carrying, lifting, bending, sitting, standing, squatting, sleeping, transfers, and locomotion level  PARTICIPATION LIMITATIONS: meal prep, cleaning, laundry, driving, shopping, community activity, occupation, and yard work  PERSONAL FACTORS Time since onset of injury/illness/exacerbation are also affecting patient's functional outcome.   REHAB POTENTIAL: Good  CLINICAL DECISION MAKING: Stable/uncomplicated  EVALUATION COMPLEXITY: Low   GOALS: SHORT TERM GOALS: Target date: 04/12/2022  Patient will be independent with initial HEP and self-management strategies to improve functional outcomes Baseline:  Goal status: IN PROGRESS   LONG TERM GOALS: Target date: 05/03/2022  Patient will be independent with advanced HEP and self-management strategies to improve functional outcomes Baseline:  Goal status: IN PROGRESS  2.  Patient will improve FOTO to at least 65% to indicate improvement in functional outcomes Baseline: 60% Goal status: IN PROGRESS  3.  Patient will report reduction of back pain to <3/10 for improved quality of life and ability to perform ADLs  Baseline: 7-8/10 Goal status: IN PROGRESS  4. Patient will have equal to or > 4+/5 MMT throughout BLE to improve ability to perform functional mobility, stair ambulation and ADLs.  Baseline: See MMT Goal status: IN PROGRESS   PLAN: PT FREQUENCY: 1-2x/week  PT DURATION: 6 weeks  PLANNED INTERVENTIONS: Therapeutic exercises, Therapeutic activity, Neuromuscular re-education, Balance training, Gait training, Patient/Family education, Joint manipulation, Joint mobilization, Stair training, Aquatic Therapy, Dry Needling, Electrical stimulation, Spinal manipulation, Spinal mobilization, Cryotherapy, Moist heat, scar mobilization, Taping, Traction, Ultrasound, Biofeedback, Ionotophoresis '4mg'$ /ml Dexamethasone, and  Manual therapy. Marland Kitchen  PLAN FOR NEXT SESSION: Progress core strength as tolerated. Progress proximal and postural strengthening, mobility in pain free range.  1:08 PM, 04/18/22 Josue Hector PT DPT  Physical Therapist with [facility]  [phone]

## 2022-04-20 ENCOUNTER — Encounter (HOSPITAL_COMMUNITY): Payer: Self-pay

## 2022-04-20 ENCOUNTER — Ambulatory Visit (HOSPITAL_COMMUNITY): Payer: BC Managed Care – PPO

## 2022-04-20 DIAGNOSIS — M5459 Other low back pain: Secondary | ICD-10-CM | POA: Diagnosis not present

## 2022-04-20 NOTE — Therapy (Signed)
OUTPATIENT PHYSICAL THERAPY THORACOLUMBAR TREATMENT   Patient Name: ANE CONERLY MRN: [id number] DOB:1971-06-21, 51 y.o., female Today's Date: 04/20/2022   PT End of Session - 04/20/22 1202     Visit Number 5    Number of Visits 12    Date for PT Re-Evaluation 05/03/22    Authorization Type [employer]BS COMM PPO (no VL, no auth req)    Progress Note Due on Visit 10    PT Start Time 1122    PT Stop Time 1200    PT Time Calculation (min) 38 min    Activity Tolerance Patient tolerated treatment well    Behavior During Therapy WFL for tasks assessed/performed               Past Medical History:  Diagnosis Date   Anxiety    Arthritis    CAD (coronary artery disease)    a. NSTEMI (peak trop 0.06) 10/2014 - cath showing first diagonal with 50-70% ostial narrowing, small in distribution, widely patent LAD/LCx/RCA, EF normal.   Depression    Diabetes mellitus with complication ([place])    Dyslipidemia    Hypertension    Hypertriglyceridemia    NSTEMI (non-ST elevated myocardial infarction) ([place])    Obesity    Tobacco abuse    Past Surgical History:  Procedure Laterality Date   BIOPSY  10/03/2021   Procedure: BIOPSY;  Surgeon: Eloise Harman, DO;  [facility];  Service: Endoscopy;;   BIOPSY  01/02/2022   Procedure: BIOPSY;  Surgeon: Eloise Harman, DO;  [facility];  Service: Endoscopy;;   CESAREAN SECTION     CHOLECYSTECTOMY     COLONOSCOPY WITH PROPOFOL N/A 10/03/2021   Procedure: COLONOSCOPY WITH PROPOFOL;  Surgeon: Eloise Harman, DO;  [facility];  Service: Endoscopy;  Laterality: N/A;  8:45am   ESOPHAGOGASTRODUODENOSCOPY (EGD) WITH PROPOFOL N/A 10/03/2021   Procedure: ESOPHAGOGASTRODUODENOSCOPY (EGD) WITH PROPOFOL;  Surgeon: Eloise Harman, DO;  [facility];  Service: Endoscopy;  Laterality: N/A;   ESOPHAGOGASTRODUODENOSCOPY (EGD) WITH PROPOFOL N/A 01/02/2022   Procedure: ESOPHAGOGASTRODUODENOSCOPY (EGD) WITH PROPOFOL;   Surgeon: Eloise Harman, DO;  [facility];  Service: Endoscopy;  Laterality: N/A;  7:30am   LEFT HEART CATHETERIZATION WITH CORONARY ANGIOGRAM N/A 11/02/2014   Procedure: LEFT HEART CATHETERIZATION WITH CORONARY ANGIOGRAM;  Surgeon: Belva Crome, MD;  Location: [facility];  Service: Cardiovascular;  Laterality: N/A;   POLYPECTOMY  10/03/2021   Procedure: POLYPECTOMY INTESTINAL;  Surgeon: Eloise Harman, DO;  [facility]: [facility];  Service: Endoscopy;;   Patient Active Problem List   Diagnosis Date Noted   COPD  GOLD 0/ active smoker 10/25/2021   Cigarette smoker 10/25/2021   Dysphagia 09/14/2021   Colon cancer screening 09/14/2021   Anogenital warts 08/31/2021   Coronary artery disease 08/31/2021   Gastroesophageal reflux disease 08/31/2021   Major depression single episode, in partial remission ([place]) 08/31/2021   Major depressive disorder 08/31/2021   Stress incontinence (female) (female) 08/31/2021   Type 2 diabetes mellitus ([place]) 08/31/2021   Hypertension 08/31/2021   Hyperlipidemia 08/31/2021   Body mass index (BMI) 30.0-30.9, adult 08/31/2021   Essential hypertension 11/02/2014   Hyposmolality and/or hyponatremia 11/02/2014   Hypertriglyceridemia 11/02/2014   Leukocytosis 11/02/2014   NSTEMI (non-ST elevated myocardial infarction) ([place]) 11/01/2014   Obesity 11/01/2014   Tobacco abuse 11/01/2014   Diabetes mellitus, type 2 ([place]) 11/01/2014    PCP: Eusebio Friendly, DO   REFERRING PROVIDER:  Kierstein Mullis, DO   REFERRING DIAG: PT eval/tx for M48.07 spinal stenosis of lumbosacral region   Rationale for Evaluation and Treatment Rehabilitation  THERAPY DIAG:  Other low back pain  ONSET DATE: Chronic (over 20 years)   SUBJECTIVE:                                                                                                                                                                                           SUBJECTIVE STATEMENT: Pt stated her  balance has been off, she has a close call at work Tuesday.  Stated she was a little sore following last session.  HEP going well at home, has began exercises with RTB.   Eval subjective:  Patient presents with complaint of chronic LBP. This has been going on since her 51s. She had recent xrays and MRIs. She reports recent onset of dropfoot in RLE and notes occasionally on LT as well. She denies prior treatment.   PERTINENT HISTORY:  Chronic LBP   PAIN:  Are you having pain? Yes: NPRS scale: 7/10 Pain location: low back, LT hip   Pain description: dull aching, sharp, stabbing  Aggravating factors: sitting, standing, movement of RLE  Relieving factors: laying flat    PRECAUTIONS: None  WEIGHT BEARING RESTRICTIONS No  FALLS:  Has patient fallen in last 6 months? No  LIVING ENVIRONMENT: Lives with: lives with their family and lives with their spouse Lives in: Mobile home Stairs: Yes: External: 7 steps; can reach both Has following equipment at home: None  OCCUPATION: Works at [employer] "[employer] my foot straight"   OBJECTIVE:   DIAGNOSTIC FINDINGS:  IMPRESSION: 1. No acute osseous findings or evidence of metastatic disease. 2. Moderate multifactorial spinal stenosis at L4-5 secondary to annular disc bulging, facet and ligamentous hypertrophy. There is associated moderate lateral recess and mild foraminal narrowing bilaterally. 3. Asymmetric disc bulging on the right at L3-4 contributes to mild right lateral recess and right foraminal narrowing without definite nerve root encroachment. 4. Chronic asymmetric spondylosis on the left at L5-S1 with progressive moderate to severe left foraminal narrowing and possible chronic left L5 nerve root encroachment.    PATIENT SURVEYS:  FOTO 60% function    COGNITION:  Overall cognitive status: Within functional limits for tasks assessed     SENSATION: WFL  PALPATION: Mod TTP about bilateral lumbar  paraspinals   LUMBAR ROM:   Active  A/PROM  eval  Flexion full  Extension 80% limited  Right lateral flexion 50% limited  Left lateral flexion 50% limited  Right rotation   Left rotation    (  Blank rows = not tested)  LOWER EXTREMITY MMT:    MMT Right eval Left eval  Hip flexion 5 5  Hip extension 4 (pain) 4 (pain)  Hip abduction 5 5  Hip adduction    Hip internal rotation    Hip external rotation    Knee flexion    Knee extension 5 5  Ankle dorsiflexion 4 5  Ankle plantarflexion    Ankle inversion    Ankle eversion     (Blank rows = not tested)  LUMBAR SPECIAL TESTS:  Straight leg raise test: Negative   FUNCTIONAL TESTS:  5 times sit to stand: 11.3 second   TODAY'S TREATMENT  04/20/22 STS then heel raise 2x 10 SLS Lt 60", Rt 57" first attempt Vector stance 3x5" Reviewed proper lifting with 18# box, cueing for mechanics  [employer] Retro 4 then 5Pl, sidestep 3then 4Pl 5RT [place] with BTB in partial tandem stance   04/18/22 Heel raise 2 x 10 Toe raise 2 x 10 Tandem stance 2 x 30" Step taps 6 inch x20  Step ups 6 inch x10 Power ups 6 inch x10 each  Mini squat with RTB at knees 2 x 10 Standing hip abduction RTB x20  Standing hip extension RTB x20  03/30/22 POE 3 min (decreased pain from 7 to 6/10) Ab brace 10 x 5" AB march 2 x 10 Clamshell 2 x10  Bridge (partial range) 2 x 10 Supine hip adduction 15 x 5"  03/23/22: Reviewed goals, reviewed HEP compliance POE with 2 pillow under hips for pain control Supine Deep breathing into stomach, exhale through mouth x 2 min to reduce accessory mm  Abdominal sets inhale through nose, exhale through mouth with exertion  Bent knee lift with ab sets  Bridge partial range WBOS with heel press 10x 3" Log rolling  Eval HEP development    PATIENT EDUCATION:  Education details: on eval findings, POC and HEP  Person educated: Patient Education method: Explanation Education comprehension: verbalized  understanding   HOME EXERCISE PROGRAM:  Access Code: [email or web address] URL: [email or web address] 04/20/22 Vector stance  04/18/22 - Standing Heel Raise with Support  - 2 x daily - 7 x weekly - 2 sets - 10 reps - Standing Toe Raises at Chair  - 2 x daily - 7 x weekly - 2 sets - 10 reps - Hip Abduction with Resistance Loop  - 2 x daily - 7 x weekly - 2 sets - 10 reps - Hip Extension with Resistance Loop  - 2 x daily - 7 x weekly - 2 sets - 10 reps - Tandem Stance with Support  - 2 x daily - 7 x weekly - 1 sets - 3 reps - 30 second hold  Date: 03/30/2022 Prepared by: Josue Hector  Exercises - Static Prone on Elbows  - 2-3 x daily - 7 x weekly - 1 sets - 1 reps - 2-3 minutes  hold - Supine Transversus Abdominis Bracing - Hands on Stomach  - 2-3 x daily - 7 x weekly - 1-2 sets - 10 reps - 5 second hold - Supine March  - 2-3 x daily - 7 x weekly - 1 sets - 10 reps - Clamshell  - 2-3 x daily - 7 x weekly - 1 sets - 10 reps - 3-5 second hold - Supine Hip Adduction Isometric with Ball  - 2-3 x daily - 7 x weekly - 1 sets - 10 reps - 3-5 second hold - Supine Bridge  - 2-3 x  daily - 7 x weekly - 1 sets - 10 reps - 3-5 second hold URL: [email or web address] Date: 03/22/2022 Prepared by: Josue Hector  Exercises - Static Prone on Elbows  - 3 x daily - 7 x weekly - 1 sets - 1 reps - 2-3 minutes  hold - Supine Transversus Abdominis Bracing - Hands on Stomach  - 3 x daily - 7 x weekly - 1-2 sets - 10 reps - 5 second hold  03/23/22: bent knee raise with ab set  2 pillow under hips with POE  ASSESSMENT:  CLINICAL IMPRESSION: Session focus on LE strengthening and core stability to assist with balance issues.  Pt able to complete static SLS 60" first attempt, more difficulty with dynamic movements.  Added exercises for stability this session.  Added vector stance to HEP.  Also educated proper lifting mechanics for work related which required most cueing this session.  No  reports of increased pain this session, was limited by fatigue.     OBJECTIVE IMPAIRMENTS Abnormal gait, decreased activity tolerance, decreased balance, decreased mobility, difficulty walking, decreased ROM, decreased strength, increased fascial restrictions, improper body mechanics, and pain.   ACTIVITY LIMITATIONS carrying, lifting, bending, sitting, standing, squatting, sleeping, transfers, and locomotion level  PARTICIPATION LIMITATIONS: meal prep, cleaning, laundry, driving, shopping, community activity, occupation, and yard work  PERSONAL FACTORS Time since onset of injury/illness/exacerbation are also affecting patient's functional outcome.   REHAB POTENTIAL: Good  CLINICAL DECISION MAKING: Stable/uncomplicated  EVALUATION COMPLEXITY: Low   GOALS: SHORT TERM GOALS: Target date: 04/12/2022  Patient will be independent with initial HEP and self-management strategies to improve functional outcomes Baseline:  Goal status: IN PROGRESS   LONG TERM GOALS: Target date: 05/03/2022  Patient will be independent with advanced HEP and self-management strategies to improve functional outcomes Baseline:  Goal status: IN PROGRESS  2.  Patient will improve FOTO to at least 65% to indicate improvement in functional outcomes Baseline: 60% Goal status: IN PROGRESS  3.  Patient will report reduction of back pain to <3/10 for improved quality of life and ability to perform ADLs  Baseline: 7-8/10 Goal status: IN PROGRESS  4. Patient will have equal to or > 4+/5 MMT throughout BLE to improve ability to perform functional mobility, stair ambulation and ADLs.  Baseline: See MMT Goal status: IN PROGRESS   PLAN: PT FREQUENCY: 1-2x/week  PT DURATION: 6 weeks  PLANNED INTERVENTIONS: Therapeutic exercises, Therapeutic activity, Neuromuscular re-education, Balance training, Gait training, Patient/Family education, Joint manipulation, Joint mobilization, Stair training, Aquatic Therapy, Dry  Needling, Electrical stimulation, Spinal manipulation, Spinal mobilization, Cryotherapy, Moist heat, scar mobilization, Taping, Traction, Ultrasound, Biofeedback, Ionotophoresis '4mg'$ /ml Dexamethasone, and Manual therapy. Marland Kitchen  PLAN FOR NEXT SESSION: Progress core strength as tolerated. Progress proximal and postural strengthening, mobility in pain free range.  Next session continue with proper lifting mechanics.  Progress stability with additional lunges and warrior poses. Incorporate exercises with additional HEP that can be done at work as pt works late hours.   Ihor Austin, LPTA/CLT; CBIS [phone]  12:17 PM, 04/20/22

## 2022-04-24 ENCOUNTER — Encounter (HOSPITAL_COMMUNITY): Payer: BC Managed Care – PPO | Admitting: Physical Therapy

## 2022-04-26 ENCOUNTER — Encounter (HOSPITAL_COMMUNITY): Payer: BC Managed Care – PPO | Admitting: Physical Therapy

## 2022-05-09 ENCOUNTER — Encounter: Payer: Self-pay | Admitting: *Deleted

## 2022-05-15 ENCOUNTER — Ambulatory Visit (HOSPITAL_COMMUNITY): Payer: BC Managed Care – PPO | Attending: Family Medicine | Admitting: Physical Therapy

## 2022-05-15 DIAGNOSIS — M5459 Other low back pain: Secondary | ICD-10-CM

## 2022-05-15 NOTE — Therapy (Signed)
OUTPATIENT PHYSICAL THERAPY THORACOLUMBAR TREATMENT PHYSICAL THERAPY DISCHARGE SUMMARY  Visits from Start of Care: 6  Current functional level related to goals / functional outcomes: Improved ROM and strength but no change of pain    Remaining deficits: Pain    Education / Equipment: HEP   Patient agrees to discharge. Patient goals were not met. Patient is being discharged due to the patient's request.   Patient Name: Alyssa Morales MRN: [id number] DOB:06/22/1971, 51 y.o., female Today's Date: 05/15/2022   PT End of Session - 05/15/22 11252    Visit Number 6    Number of Visits 6    Authorization Type BCBS COMM PPO (no VL, no auth req)    Progress Note Due on Visit 6    PT Start Time 1115    PT Stop Time 1145    PT Time Calculation (min) 30 min    Activity Tolerance Patient tolerated treatment well    Behavior During Therapy WFL for tasks assessed/performed            Past Medical History:  Diagnosis Date   Anxiety    Arthritis    CAD (coronary artery disease)    a. NSTEMI (peak trop 0.06) 10/2014 - cath showing first diagonal with 50-70% ostial narrowing, small in distribution, widely patent LAD/LCx/RCA, EF normal.   Depression    Diabetes mellitus with complication ([place])    Dyslipidemia    Hypertension    Hypertriglyceridemia    NSTEMI (non-ST elevated myocardial infarction) ([place])    Obesity    Tobacco abuse    Past Surgical History:  Procedure Laterality Date   BIOPSY  10/03/2021   Procedure: BIOPSY;  Surgeon: Eloise Harman, DO;  [facility];  Service: Endoscopy;;   BIOPSY  01/02/2022   Procedure: BIOPSY;  Surgeon: Eloise Harman, DO;  [facility];  Service: Endoscopy;;   CESAREAN SECTION     CHOLECYSTECTOMY     COLONOSCOPY WITH PROPOFOL N/A 10/03/2021   Procedure: COLONOSCOPY WITH PROPOFOL;  Surgeon: Eloise Harman, DO;  [facility];  Service: Endoscopy;  Laterality: N/A;  8:45am   ESOPHAGOGASTRODUODENOSCOPY  (EGD) WITH PROPOFOL N/A 10/03/2021   Procedure: ESOPHAGOGASTRODUODENOSCOPY (EGD) WITH PROPOFOL;  Surgeon: Eloise Harman, DO;  [facility];  Service: Endoscopy;  Laterality: N/A;   ESOPHAGOGASTRODUODENOSCOPY (EGD) WITH PROPOFOL N/A 01/02/2022   Procedure: ESOPHAGOGASTRODUODENOSCOPY (EGD) WITH PROPOFOL;  Surgeon: Eloise Harman, DO;  [facility];  Service: Endoscopy;  Laterality: N/A;  7:30am   LEFT HEART CATHETERIZATION WITH CORONARY ANGIOGRAM N/A 11/02/2014   Procedure: LEFT HEART CATHETERIZATION WITH CORONARY ANGIOGRAM;  Surgeon: Belva Crome, MD;  Location: [facility] CATH LAB;  Service: Cardiovascular;  Laterality: N/A;   POLYPECTOMY  10/03/2021   Procedure: POLYPECTOMY INTESTINAL;  Surgeon: Eloise Harman, DO;  [facility];  Service: Endoscopy;;   Patient Active Problem List   Diagnosis Date Noted   COPD  GOLD 0/ active smoker 10/25/2021   Cigarette smoker 10/25/2021   Dysphagia 09/14/2021   Colon cancer screening 09/14/2021   Anogenital warts 08/31/2021   Coronary artery disease 08/31/2021   Gastroesophageal reflux disease 08/31/2021   Major depression single episode, in partial remission ([place]) 08/31/2021   Major depressive disorder 08/31/2021   Stress incontinence (female) (female) 08/31/2021   Type 2 diabetes mellitus ([place]) 08/31/2021   Hypertension 08/31/2021   Hyperlipidemia 08/31/2021   Body mass index (BMI) 30.0-30.9, adult 08/31/2021   Essential hypertension 11/02/2014  Hyposmolality and/or hyponatremia 11/02/2014   Hypertriglyceridemia 11/02/2014   Leukocytosis 11/02/2014   NSTEMI (non-ST elevated myocardial infarction) ([place]) 11/01/2014   Obesity 11/01/2014   Tobacco abuse 11/01/2014   Diabetes mellitus, type 2 ([place]) 11/01/2014    PCP: Eusebio Friendly, DO   REFERRING PROVIDER: Eusebio Friendly, DO   REFERRING DIAG: PT eval/tx for M48.07 spinal stenosis of lumbosacral region   Rationale for Evaluation and Treatment  Rehabilitation  THERAPY DIAG:  Other low back pain  ONSET DATE: Chronic (over 20 years)   SUBJECTIVE:                                                                                                                                                                                           SUBJECTIVE STATEMENT:   Pt has not been to therapy since 04/20/22.  States that the department had no visits.   Pt states that her pain has not changed since starting therapy  she has pain on both sides B.  She has pain that goes down into her legs periodically, sometimes it goes down her Rt then other times down her LT.  The pain will go down past her knee area.  PERTINENT HISTORY:  Chronic LBP   PAIN:  Are you having pain? Yes: NPRS scale: 6/10 Pain location: low back, LT hip   Pain description: dull aching, sharp, stabbing  Aggravating factors: sitting, standing, movement of RLE  Relieving factors: laying flat    PRECAUTIONS: None  WEIGHT BEARING RESTRICTIONS No  FALLS:  Has patient fallen in last 6 months? No  LIVING ENVIRONMENT: Lives with: lives with their family and lives with their spouse Lives in: Mobile home Stairs: Yes: External: 7 steps; can reach both Has following equipment at home: None  OCCUPATION: Works at [employer]"   OBJECTIVE:   DIAGNOSTIC FINDINGS:  IMPRESSION: 1. No acute osseous findings or evidence of metastatic disease. 2. Moderate multifactorial spinal stenosis at L4-5 secondary to annular disc bulging, facet and ligamentous hypertrophy. There is associated moderate lateral recess and mild foraminal narrowing bilaterally. 3. Asymmetric disc bulging on the right at L3-4 contributes to mild right lateral recess and right foraminal narrowing without definite nerve root encroachment. 4. Chronic asymmetric spondylosis on the left at L5-S1 with progressive moderate to severe left foraminal narrowing and  possible chronic left L5 nerve root encroachment.    PATIENT SURVEYS:  FOTO 60% function  05/15/22 55%  COGNITION:  Overall cognitive status: Within functional limits for tasks assessed     SENSATION: WFL  PALPATION: Mod TTP about bilateral lumbar paraspinals  LUMBAR ROM:   Active  A/PROM  eval   Flexion full   Extension 80% limited 20 degrees limited 25%  Right lateral flexion 50% limited 25 degrees  Left lateral flexion 50% limited 25 degrees  Right rotation    Left rotation     (Blank rows = not tested)  LOWER EXTREMITY MMT:    MMT Right eval Right  05/15/22  Left eval Left   Hip flexion 5  5   Hip extension 4 (pain) 4+ 4 (pain) 4+   Hip abduction 5  5   Hip adduction      Hip internal rotation      Hip external rotation      Knee flexion      Knee extension 5  5   Ankle dorsiflexion 4 4+ 5   Ankle plantarflexion      Ankle inversion      Ankle eversion       (Blank rows = not tested)  LUMBAR SPECIAL TESTS:  Straight leg raise test: Negative   FUNCTIONAL TESTS:  5 times sit to stand: 11.3 second           11/6:  10.5   TODAY'S TREATMENT  05/15/22 Supine: Knee to chest B x 3 Hamstring stretch B x 3  04/20/22 STS then heel raise 2x 10 SLS Lt 60", Rt 57" first attempt Vector stance 3x5" Reviewed proper lifting with 18# box, cueing for mechanics  [employer] Retro 4 then 5Pl, sidestep 3then 4Pl 5RT [place] with BTB in partial tandem stance   04/18/22 Heel raise 2 x 10 Toe raise 2 x 10 Tandem stance 2 x 30" Step taps 6 inch x20  Step ups 6 inch x10 Power ups 6 inch x10 each  Mini squat with RTB at knees 2 x 10 Standing hip abduction RTB x20  Standing hip extension RTB x20  03/30/22 POE 3 min (decreased pain from 7 to 6/10) Ab brace 10 x 5" AB march 2 x 10 Clamshell 2 x10  Bridge (partial range) 2 x 10 Supine hip adduction 15 x 5"  03/23/22: Reviewed goals, reviewed HEP compliance POE with 2 pillow under hips for pain control Supine  Deep breathing into stomach, exhale through mouth x 2 min to reduce accessory mm  Abdominal sets inhale through nose, exhale through mouth with exertion  Bent knee lift with ab sets  Bridge partial range WBOS with heel press 10x 3" Log rolling  Eval HEP development    PATIENT EDUCATION:  Education details: on eval findings, POC and HEP  Person educated: Patient Education method: Explanation Education comprehension: verbalized understanding   HOME EXERCISE PROGRAM:   05/15/22: Prone: Press up x5 Hip extension x 10 Alternate arm/leg raise x 10   Access Code: [email or web address] URL: [email or web address] 04/20/22 Vector stance  04/18/22 - Standing Heel Raise with Support  - 2 x daily - 7 x weekly - 2 sets - 10 reps - Standing Toe Raises at Chair  - 2 x daily - 7 x weekly - 2 sets - 10 reps - Hip Abduction with Resistance Loop  - 2 x daily - 7 x weekly - 2 sets - 10 reps - Hip Extension with Resistance Loop  - 2 x daily - 7 x weekly - 2 sets - 10 reps - Tandem Stance with Support  - 2 x daily - 7 x weekly - 1 sets - 3 reps - 30 second hold  Date: 03/30/2022 Prepared by: Josue Hector  Exercises -  Static Prone on Elbows  - 2-3 x daily - 7 x weekly - 1 sets - 1 reps - 2-3 minutes  hold - Supine Transversus Abdominis Bracing - Hands on Stomach  - 2-3 x daily - 7 x weekly - 1-2 sets - 10 reps - 5 second hold - Supine March  - 2-3 x daily - 7 x weekly - 1 sets - 10 reps - Clamshell  - 2-3 x daily - 7 x weekly - 1 sets - 10 reps - 3-5 second hold - Supine Hip Adduction Isometric with Ball  - 2-3 x daily - 7 x weekly - 1 sets - 10 reps - 3-5 second hold - Supine Bridge  - 2-3 x daily - 7 x weekly - 1 sets - 10 reps - 3-5 second hold URL: [email or web address] Date: 03/22/2022 Prepared by: Josue Hector  Exercises - Static Prone on Elbows  - 3 x daily - 7 x weekly - 1 sets - 1 reps - 2-3 minutes  hold - Supine Transversus Abdominis Bracing - Hands on  Stomach  - 3 x daily - 7 x weekly - 1-2 sets - 10 reps - 5 second hold  03/23/22: bent knee raise with ab set  2 pillow under hips with POE  ASSESSMENT:  CLINICAL IMPRESSION: Pt state that she has not been seen since 10/6 due to department not having any appointments.  PT states that she is ready to be done with therapy, she can tell she is stronger and moving better but the pain is about the same.  Updated HEP in hopes of improving pain in the future.     OBJECTIVE IMPAIRMENTS Abnormal gait, decreased activity tolerance, decreased balance, decreased mobility, difficulty walking, decreased ROM, decreased strength, increased fascial restrictions, improper body mechanics, and pain.   ACTIVITY LIMITATIONS carrying, lifting, bending, sitting, standing, squatting, sleeping, transfers, and locomotion level  PARTICIPATION LIMITATIONS: meal prep, cleaning, laundry, driving, shopping, community activity, occupation, and yard work  PERSONAL FACTORS Time since onset of injury/illness/exacerbation are also affecting patient's functional outcome.   REHAB POTENTIAL: Good  CLINICAL DECISION MAKING: Stable/uncomplicated  EVALUATION COMPLEXITY: Low   GOALS: SHORT TERM GOALS: Target date: 04/12/2022  Patient will be independent with initial HEP and self-management strategies to improve functional outcomes Baseline:  Goal status: NOT MET   LONG TERM GOALS: Target date: 05/03/2022  Patient will be independent with advanced HEP and self-management strategies to improve functional outcomes Baseline:  Goal status: NOT MET  2.  Patient will improve FOTO to at least 65% to indicate improvement in functional outcomes Baseline: 60% Goal status: NOT MET  3.  Patient will report reduction of back pain to <3/10 for improved quality of life and ability to perform ADLs  Baseline: 7-8/10 Goal status: NOT MET  4. Patient will have equal to or > 4+/5 MMT throughout BLE to improve ability to perform  functional mobility, stair ambulation and ADLs.  Baseline: See MMT Goal status: MET   PLAN: PT FREQUENCY: 1-2x/week  PT DURATION: 6 weeks  PLANNED INTERVENTIONS: Therapeutic exercises, Therapeutic activity, Neuromuscular re-education, Balance training, Gait training, Patient/Family education, Joint manipulation, Joint mobilization, Stair training, Aquatic Therapy, Dry Needling, Electrical stimulation, Spinal manipulation, Spinal mobilization, Cryotherapy, Moist heat, scar mobilization, Taping, Traction, Ultrasound, Biofeedback, Ionotophoresis 79m/ml Dexamethasone, and Manual therapy. .Marland Kitchen PLAN FOR NEXT SESSION: PT to be discharged per request.  No STG and only 1/4 LTG was met.  CRayetta Humphrey PPleasantonCLT [phone]

## 2022-05-17 ENCOUNTER — Encounter (HOSPITAL_COMMUNITY): Payer: BC Managed Care – PPO

## 2022-07-06 ENCOUNTER — Other Ambulatory Visit (HOSPITAL_COMMUNITY): Payer: Self-pay | Admitting: Family Medicine

## 2022-07-06 DIAGNOSIS — Z720 Tobacco use: Secondary | ICD-10-CM

## 2022-08-15 ENCOUNTER — Encounter (HOSPITAL_COMMUNITY): Payer: Self-pay

## 2022-08-15 ENCOUNTER — Ambulatory Visit (HOSPITAL_COMMUNITY): Admission: RE | Admit: 2022-08-15 | Payer: BC Managed Care – PPO | Source: Ambulatory Visit

## 2022-09-07 ENCOUNTER — Encounter: Payer: Self-pay | Admitting: Radiology

## 2022-09-08 ENCOUNTER — Encounter: Payer: Self-pay | Admitting: *Deleted

## 2022-10-25 ENCOUNTER — Telehealth: Payer: Self-pay | Admitting: *Deleted

## 2022-10-25 NOTE — Telephone Encounter (Signed)
Spoke with pt. She has now been rescheduled for lung screening CT that has been ordered by her PCP. Nothing further is needed at this time.

## 2022-10-25 NOTE — Telephone Encounter (Signed)
Patient had cancelled CT scan for LCS on 08/2022 and wanted to know if she needed it with or without contrast.   Per patient, Patient's primary provider gave her conflicting information. Please call and clarify with patient for her to get rescheduled.  Best contact [phone]

## 2022-10-30 ENCOUNTER — Other Ambulatory Visit: Payer: Self-pay | Admitting: Internal Medicine

## 2022-10-30 NOTE — Telephone Encounter (Signed)
Any future refills will require a apt 

## 2022-11-07 ENCOUNTER — Ambulatory Visit (HOSPITAL_COMMUNITY): Admission: RE | Admit: 2022-11-07 | Payer: BC Managed Care – PPO | Source: Ambulatory Visit

## 2022-11-22 ENCOUNTER — Ambulatory Visit (HOSPITAL_COMMUNITY)
Admission: RE | Admit: 2022-11-22 | Discharge: 2022-11-22 | Disposition: A | Discharge status: Home / Self Care | Payer: BC Managed Care – PPO | Source: Ambulatory Visit | Attending: Family Medicine | Admitting: Family Medicine

## 2022-11-22 DIAGNOSIS — Z720 Tobacco use: Secondary | ICD-10-CM | POA: Diagnosis present

## 2022-11-29 ENCOUNTER — Telehealth: Payer: Self-pay | Admitting: *Deleted

## 2022-11-29 NOTE — Telephone Encounter (Signed)
Called and spoke w/ pt let her know that I would send MW a message for his impression on scan.   Dr.Wert please advise pt is requesting result of CT done on 5/13, please review. Thanks!

## 2022-11-29 NOTE — Telephone Encounter (Signed)
Patient  called and would like to go over results of CT scan completed 11/22/2022. Please call and advise patient.  [phone]

## 2022-11-29 NOTE — Telephone Encounter (Signed)
I am off this pm but can call tomorrow if she'll give me a window or two  when she'll be available

## 2023-02-20 ENCOUNTER — Other Ambulatory Visit (HOSPITAL_COMMUNITY): Payer: Self-pay | Admitting: Family Medicine

## 2023-02-20 DIAGNOSIS — Z1231 Encounter for screening mammogram for malignant neoplasm of breast: Secondary | ICD-10-CM

## 2023-02-28 ENCOUNTER — Ambulatory Visit (HOSPITAL_COMMUNITY)
Admission: RE | Admit: 2023-02-28 | Discharge: 2023-02-28 | Disposition: A | Discharge status: Home / Self Care | Payer: BC Managed Care – PPO | Source: Ambulatory Visit | Attending: Family Medicine | Admitting: Family Medicine

## 2023-02-28 DIAGNOSIS — Z1231 Encounter for screening mammogram for malignant neoplasm of breast: Secondary | ICD-10-CM | POA: Insufficient documentation

## 2023-10-04 ENCOUNTER — Other Ambulatory Visit: Payer: Self-pay | Admitting: *Deleted

## 2023-10-04 ENCOUNTER — Telehealth: Payer: Self-pay | Admitting: *Deleted

## 2023-10-04 DIAGNOSIS — F17210 Nicotine dependence, cigarettes, uncomplicated: Secondary | ICD-10-CM

## 2023-10-04 DIAGNOSIS — Z122 Encounter for screening for malignant neoplasm of respiratory organs: Secondary | ICD-10-CM

## 2023-10-04 DIAGNOSIS — Z87891 Personal history of nicotine dependence: Secondary | ICD-10-CM

## 2023-10-04 NOTE — Telephone Encounter (Signed)
 Lung Cancer Screening Narrative/Criteria Questionnaire (Cigarette Smokers Only- No Cigars/Pipes/vapes)   Alyssa Morales   SDMV:11/20/23 8:30- [name]                                           August 19, 1970              LDCT: 11/22/23 9:00- [facility]    52 y.o.   Phone: [phone]  Lung Screening Narrative (confirm age 65-77 yrs Medicare / 50-80 yrs Private pay insurance)   Insurance information:BCBS   Referring Provider:Wert   This screening involves an initial phone call with a team member from our program. It is called a shared decision making visit. The initial meeting is required by insurance and Medicare to make sure you understand the program. This appointment takes about 15-20 minutes to complete. The CT scan will completed at a separate date/time. This scan takes about 5-10 minutes to complete and you may eat and drink before and after the scan.  Criteria questions for Lung Cancer Screening:   Are you a current or former smoker? Current Age began smoking: 14   If you are a former smoker, what year did you quit smoking? (within 15 yrs)   To calculate your smoking history, I need an accurate estimate of how many packs of cigarettes you smoked per day and for how many years. (Not just the number of PPD you are now smoking)   Years smoking 38 x Packs per day 1/4 -  = Pack years 38   (at least 20 pack yrs)   (Make sure they understand that we need to know how much they have smoked in the past, not just the number of PPD they are smoking now)  Do you have a personal history of cancer?  No    Do you have a family history of cancer? Yes  (cancer type and and relative) mother (Breast) Cousin ( Leukemia)   Are you coughing up blood?  No  Have you had unexplained weight loss of 15 lbs or more in the last 6 months? No  It looks like you meet all criteria.     Additional information: N/A

## 2023-10-12 ENCOUNTER — Other Ambulatory Visit (INDEPENDENT_AMBULATORY_CARE_PROVIDER_SITE_OTHER): Payer: Self-pay

## 2023-10-12 ENCOUNTER — Ambulatory Visit: Admitting: Orthopedic Surgery

## 2023-10-12 ENCOUNTER — Encounter: Payer: Self-pay | Admitting: Orthopedic Surgery

## 2023-10-12 VITALS — BP 130/85 | HR 69 | Ht 64.0 in | Wt 141.0 lb

## 2023-10-12 DIAGNOSIS — M545 Low back pain, unspecified: Secondary | ICD-10-CM | POA: Diagnosis not present

## 2023-10-12 DIAGNOSIS — G8929 Other chronic pain: Secondary | ICD-10-CM

## 2023-10-12 DIAGNOSIS — M7062 Trochanteric bursitis, left hip: Secondary | ICD-10-CM

## 2023-10-12 NOTE — Progress Notes (Signed)
 New Patient Visit  Assessment: Alyssa Morales is a 53 y.o. female with the following: 1. Chronic left-sided low back pain without sciatica 2. Greater trochanteric bursitis of left hip  Plan: Alyssa Morales has pain within the left buttock.  Could represent irritation of the sciatic nerve, although she does not have any pain radiating distally.  This could also be piriformis syndrome.  In addition, she does have some tenderness over the greater trochanter laterally.  From the lateral hip, does radiate to the mid thigh.  This could represent greater trochanteric bursitis.  I recommended a steroid injection.  This was completed in clinic today.  I have also provided her with some physical exercise for her lower back.  This can help strengthen and improve her pain overall.  She can also consider formal physical therapy.  Continue with Tylenol or ibuprofen.  Follow-up as needed.   Procedure note injection - Left lateral hip   Verbal consent was obtained to inject the Left lateral hip.  Patient localized the pain. Timeout was completed to confirm the site of injection.  The skin was prepped with alcohol and ethyl chloride was sprayed at the injection site.  A 21-gauge needle was used to inject 40 mg of Depo-Medrol and 1% lidocaine (4 cc) into the Left lateral hip, directly over the localized tenderness using a direct lateral approach.  There were no complications. A sterile bandage was applied.   Follow-up: No follow-ups on file.  Subjective:  Chief Complaint  Patient presents with   Back Pain    Bilat but L > R going into both thighs     History of Present Illness: Alyssa Morales is a 53 y.o. female who has been referred by  Orpah Cobb, DO for evaluation of left hip pain.  She localizes the pain to the left buttock, as well as the lateral hip.  She has some pain radiating distally.  She has been taking ibuprofen, with limited improvement in her symptoms.  She has not recently started  taking Tylenol.  No prior injections.  She has not worked with a Paramedic.  No numbness or tingling.  She states that she has had chronic lower back pain for 30 years.   Review of Systems: No fevers or chills No numbness or tingling No chest pain No shortness of breath No bowel or bladder dysfunction No GI distress No headaches   Medical History:  Past Medical History:  Diagnosis Date   Anxiety    Arthritis    CAD (coronary artery disease)    a. NSTEMI (peak trop 0.06) 10/2014 - cath showing first diagonal with 50-70% ostial narrowing, small in distribution, widely patent LAD/LCx/RCA, EF normal.   Depression    Diabetes mellitus with complication ([facility])    Dyslipidemia    Hypertension    Hypertriglyceridemia    NSTEMI (non-ST elevated myocardial infarction) ([facility])    Obesity    Tobacco abuse     Past Surgical History:  Procedure Laterality Date   BIOPSY  10/03/2021   Procedure: BIOPSY;  Surgeon: Lanelle Bal, DO;  [facility];  Service: Endoscopy;;   BIOPSY  01/02/2022   Procedure: BIOPSY;  Surgeon: Lanelle Bal, DO;  [facility];  Service: Endoscopy;;   CESAREAN SECTION     CHOLECYSTECTOMY     COLONOSCOPY WITH PROPOFOL N/A 10/03/2021   Procedure: COLONOSCOPY WITH PROPOFOL;  Surgeon: Lanelle Bal, DO;  [facility];  Service: Endoscopy;  Laterality: N/A;  8:45am   ESOPHAGOGASTRODUODENOSCOPY (EGD) WITH PROPOFOL N/A 10/03/2021   Procedure: ESOPHAGOGASTRODUODENOSCOPY (EGD) WITH PROPOFOL;  Surgeon: Lanelle Bal, DO;  [facility];  Service: Endoscopy;  Laterality: N/A;   ESOPHAGOGASTRODUODENOSCOPY (EGD) WITH PROPOFOL N/A 01/02/2022   Procedure: ESOPHAGOGASTRODUODENOSCOPY (EGD) WITH PROPOFOL;  Surgeon: Lanelle Bal, DO;  [facility];  Service: Endoscopy;  Laterality: N/A;  7:30am   LEFT HEART CATHETERIZATION WITH CORONARY ANGIOGRAM N/A 11/02/2014   Procedure: LEFT HEART CATHETERIZATION WITH CORONARY  ANGIOGRAM;  Surgeon: Lyn Records, MD;  Location[facility] CATH LAB;  Service: Cardiovascular;  Laterality: N/A;   POLYPECTOMY  10/03/2021   Procedure: POLYPECTOMY INTESTINAL;  Surgeon: Lanelle Bal, DO;  [facility];  Service: Endoscopy;;    Family History  Problem Relation Age of Onset   Hypertension Mother    Diabetes Mother    Breast cancer Mother    Coronary artery disease Father    Diabetes Maternal Grandmother    Leukemia Cousin    Colon polyps Maternal Uncle    Colon cancer Neg Hx    Social History   Tobacco Use   Smoking status: Every Day    Current packs/day: 1.00    Average packs/day: 1 pack/day for 38.9 years (38.9 ttl pk-yrs)    Types: Cigarettes    Start date: 11/09/1984   Smokeless tobacco: Never  Vaping Use   Vaping status: Former  Substance Use Topics   Alcohol use: Yes    Comment: once every 3 months   Drug use: Yes    Frequency: 7.0 times per week    Types: Marijuana    Comment: uses for depression   smoked 12/28/21    No Known Allergies  Current Meds  Medication Sig   albuterol (PROVENTIL HFA;VENTOLIN HFA) 108 (90 BASE) MCG/ACT inhaler Inhale 1 puff into the lungs every 6 (six) hours as needed for wheezing or shortness of breath.   aspirin EC 81 MG EC tablet Take 1 tablet (81 mg total) by mouth daily.   buPROPion (WELLBUTRIN XL) 300 MG 24 hr tablet Take 300 mg by mouth every morning.   Cholecalciferol (VITAMIN D3) 50 MCG (2000 UT) capsule Take 2,000 Units by mouth daily.   irbesartan (AVAPRO) 150 MG tablet TAKE 1 TABLET BY MOUTH EVERY DAY   metFORMIN (GLUCOPHAGE) 500 MG tablet Take 2 tablets (1,000 mg total) by mouth 2 (two) times daily with a meal.   Multiple Vitamins-Minerals (WOMENS MULTIVITAMIN PO) Take 1 tablet by mouth daily.   nitroGLYCERIN (NITROSTAT) 0.4 MG SL tablet Place 1 tablet (0.4 mg total) under the tongue every 5 (five) minutes as needed for chest pain.   Omega-3 1000 MG CAPS Take 2,000 mg by mouth daily.   ondansetron  (ZOFRAN-ODT) 4 MG disintegrating tablet Take 4 mg by mouth every 8 (eight) hours as needed for vomiting or nausea.   pravastatin (PRAVACHOL) 40 MG tablet Take 40 mg by mouth daily.   Semaglutide (OZEMPIC, 0.25 OR 0.5 MG/DOSE, [place]) Inject 0.5 mg into the skin once a week.    Objective: BP 130/85   Pulse 69   Ht 5\' 4"  (1.626 m)   Wt 141 lb (64 kg)   LMP 10/01/2016   BMI 24.20 kg/m   Physical Exam:  General: Alert and oriented. and No acute distress. Gait: Left sided antalgic gait.  Evaluation of left hip demonstrates no deformity.  No redness.  No bruising.  She has some tenderness around the greater trochanter  laterally.  In addition, she has tenderness deep within the buttock.  No pain radiating distally.  Negative straight leg raise.  Toes warm well-perfused.  No pain in the groin with internal or external rotation.  IMAGING: I personally ordered and reviewed the following images  Lumbar spine x-rays were obtained in clinic today.  No acute injuries are noted.  Well-maintained disc height.  Diffuse signs of early degenerative changes, including small osteophytes.  No anterolisthesis.  No bony lesions.  Limited views of the hip are available, but minimal degenerative changes in bilateral hips.  Impression: Lumbar spine x-rays with diffuse degenerative changes   New Medications:  No orders of the defined types were placed in this encounter.     Oliver Barre, MD  10/12/2023 10:57 AM

## 2023-10-12 NOTE — Patient Instructions (Signed)
 Instructions Following Joint Injections  In clinic today, you received an injection in one of your joints (sometimes more than one).  Occasionally, you can have some pain at the injection site, this is normal.  You can place ice at the injection site, or take over-the-counter medications such as Tylenol (acetaminophen) or Advil (ibuprofen).  Please follow all directions listed on the bottle.  If your joint (knee or shoulder) becomes swollen, red or very painful, please contact the clinic for additional assistance.   Two medications were injected, including lidocaine and a steroid (often referred to as cortisone).  Lidocaine is effective almost immediately but wears off quickly.  However, the steroid can take a few days to improve your symptoms.  In some cases, it can make your pain worse for a couple of days.  Do not be concerned if this happens as it is common.  You can apply ice or take some over-the-counter medications as needed.   Injections in the same joint cannot be repeated for 3 months.  This helps to limit the risk of an infection in the joint.  If you were to develop an infection in your joint, the best treatment option would be surgery.    Sciatica Rehab  Ask your health care provider which exercises are safe for you. Do exercises exactly as told by your health care provider and adjust them as directed. It is normal to feel mild stretching, pulling, tightness, or discomfort as you do these exercises. Stop right away if you feel sudden pain or your pain gets worse. Do not begin these exercises until told by your health care provider.   Stretching and range-of-motion exercises These exercises warm up your muscles and joints and improve the movement and flexibility of your hips and back. These exercises also help to relieve pain, numbness, and tingling. Sciatic nerve glide Sit in a chair with your head facing down toward your chest. Place your hands behind your back. Let your shoulders  slump forward. Slowly straighten one of your legs while you tilt your head back as if you are looking toward the ceiling. Only straighten your leg as far as you can without making your symptoms worse. Hold this position for 10 seconds. Slowly return to the starting position. Repeat with your other leg. Repeat 10 times. Complete this exercise 1-2 times a day. Knee to chest with hip adduction and internal rotation  Lie on your back on a firm surface with both legs straight. Bend one of your knees and move it up toward your chest until you feel a gentle stretch in your lower back and buttock. Then, move your knee toward the shoulder that is on the opposite side from your leg. This is hip adduction and internal rotation. Hold your leg in this position by holding on to the front of your knee. Hold this position for 10 seconds. Slowly return to the starting position. Repeat with your other leg. Repeat 10 times. Complete this exercise 1-2 times a day. Prone extension on elbows  Lie on your abdomen on a firm surface. A bed may be too soft for this exercise. Prop yourself up on your elbows. Use your arms to help lift your chest up until you feel a gentle stretch in your abdomen and your lower back. This will place some of your body weight on your elbows. If this is uncomfortable, try stacking pillows under your chest. Your hips should stay down, against the surface that you are lying on. Keep your hip  and back muscles relaxed. Hold this position for 10 seconds. Slowly relax your upper body and return to the starting position. Repeat 10 times. Complete this exercise 1-2 times a day. Strengthening exercises These exercises build strength and endurance in your back. Endurance is the ability to use your muscles for a long time, even after they get tired. Pelvic tilt This exercise strengthens the muscles that lie deep in the abdomen. Lie on your back on a firm surface. Bend your knees and keep your  feet flat on the floor. Tense your abdominal muscles. Tip your pelvis up toward the ceiling and flatten your lower back into the floor. To help with this exercise, you may place a small towel under your lower back and try to push your back into the towel. Hold this position for 10 seconds. Let your muscles relax completely before you repeat this exercise. Repeat 10 times. Complete this exercise 1-2 times a day. Alternating arm and leg raises  Get on your hands and knees on a firm surface. If you are on a hard floor, you may want to use padding, such as an exercise mat, to cushion your knees. Line up your arms and legs. Your hands should be directly below your shoulders, and your knees should be directly below your hips. Lift your left leg behind you. At the same time, raise your right arm and straighten it in front of you. Do not lift your leg higher than your hip. Do not lift your arm higher than your shoulder. Keep your abdominal and back muscles tight. Keep your hips facing the ground. Do not arch your back. Keep your balance carefully, and do not hold your breath. Hold this position for 10 seconds. Slowly return to the starting position. Repeat with your right leg and your left arm. Repeat 10 times. Complete this exercise 1-2 times a day. Posture and body mechanics Good posture and healthy body mechanics can help to relieve stress in your body's tissues and joints. Body mechanics refers to the movements and positions of your body while you do your daily activities. Posture is part of body mechanics. Good posture means: Your spine is in its natural S-curve position (neutral). Your shoulders are pulled back slightly. Your head is not tipped forward. Follow these guidelines to improve your posture and body mechanics in your everyday activities. Standing  When standing, keep your spine neutral and your feet about hip width apart. Keep a slight bend in your knees. Your ears, shoulders,  and hips should line up. When you do a task in which you stand in one place for a long time, place one foot up on a stable object that is 2-4 inches (5-10 cm) high, such as a footstool. This helps keep your spine neutral. Sitting  When sitting, keep your spine neutral and keep your feet flat on the floor. Use a footrest, if necessary, and keep your thighs parallel to the floor. Avoid rounding your shoulders, and avoid tilting your head forward. When working at a desk or a computer, keep your desk at a height where your hands are slightly lower than your elbows. Slide your chair under your desk so you are close enough to maintain good posture. When working at a computer, place your monitor at a height where you are looking straight ahead and you do not have to tilt your head forward or downward to look at the screen. Resting When lying down and resting, avoid positions that are most painful for you. If  you have pain with activities such as sitting, bending, stooping, or squatting, lie in a position in which your body does not bend very much. For example, avoid curling up on your side with your arms and knees near your chest (fetal position). If you have pain with activities such as standing for a long time or reaching with your arms, lie with your spine in a neutral position and bend your knees slightly. Try the following positions: Lying on your side with a pillow between your knees. Lying on your back with a pillow under your knees. Lifting  When lifting objects, keep your feet at least shoulder width apart and tighten your abdominal muscles. Bend your knees and hips and keep your spine neutral. It is important to lift using the strength of your legs, not your back. Do not lock your knees straight out. Always ask for help to lift heavy or awkward objects. This information is not intended to replace advice given to you by your health care provider. Make sure you discuss any questions you have with  your health care provider. Document Revised: 10/18/2018 Document Reviewed: 07/18/2018 Elsevier Patient Education  2022 [employer].

## 2023-11-12 ENCOUNTER — Encounter (INDEPENDENT_AMBULATORY_CARE_PROVIDER_SITE_OTHER): Payer: Self-pay | Admitting: Ophthalmology

## 2023-11-12 ENCOUNTER — Other Ambulatory Visit: Payer: Self-pay | Admitting: Internal Medicine

## 2023-11-12 DIAGNOSIS — H35033 Hypertensive retinopathy, bilateral: Secondary | ICD-10-CM

## 2023-11-12 DIAGNOSIS — E113293 Type 2 diabetes mellitus with mild nonproliferative diabetic retinopathy without macular edema, bilateral: Secondary | ICD-10-CM | POA: Diagnosis not present

## 2023-11-12 DIAGNOSIS — I10 Essential (primary) hypertension: Secondary | ICD-10-CM | POA: Diagnosis not present

## 2023-11-12 DIAGNOSIS — Z7984 Long term (current) use of oral hypoglycemic drugs: Secondary | ICD-10-CM

## 2023-11-12 DIAGNOSIS — H43813 Vitreous degeneration, bilateral: Secondary | ICD-10-CM | POA: Diagnosis not present

## 2023-11-12 DIAGNOSIS — H2513 Age-related nuclear cataract, bilateral: Secondary | ICD-10-CM

## 2023-11-12 DIAGNOSIS — Z7985 Long-term (current) use of injectable non-insulin antidiabetic drugs: Secondary | ICD-10-CM

## 2023-11-20 ENCOUNTER — Encounter: Payer: Self-pay | Admitting: Acute Care

## 2023-11-20 ENCOUNTER — Ambulatory Visit: Admitting: Acute Care

## 2023-11-20 DIAGNOSIS — F17210 Nicotine dependence, cigarettes, uncomplicated: Secondary | ICD-10-CM | POA: Diagnosis not present

## 2023-11-20 NOTE — Progress Notes (Signed)
 Virtual Visit via Telephone Note  I connected with Alphonsus Jeans on 11/20/23 at  8:30 AM EDT by telephone and verified that I am speaking with the correct person using two identifiers.  Location: Patient:  At home Provider: [address], [place], [place], [address]    I discussed the limitations, risks, security and privacy concerns of performing an evaluation and management service by telephone and the availability of in person appointments. I also discussed with the patient that there may be a patient responsible charge related to this service. The patient expressed understanding and agreed to proceed.    Shared Decision Making Visit Lung Cancer Screening Program [phone])   Eligibility: Age 53 y.o. Pack Years Smoking History Calculation 38 pack year smoking history (# packs/per year x # years smoked) Recent History of coughing up blood  no Unexplained weight loss? no ( >Than 15 pounds within the last 6 months ) Prior History Lung / other cancer no (Diagnosis within the last 5 years already requiring surveillance chest CT Scans). Smoking Status Current Smoker Former Smokers: Years since quit:  NA  Quit Date:  NA  Visit Components: Discussion included one or more decision making aids. yes Discussion included risk/benefits of screening. yes Discussion included potential follow up diagnostic testing for abnormal scans. yes Discussion included meaning and risk of over diagnosis. yes Discussion included meaning and risk of False Positives. yes Discussion included meaning of total radiation exposure. yes  Counseling Included: Importance of adherence to annual lung cancer LDCT screening. yes Impact of comorbidities on ability to participate in the program. yes Ability and willingness to under diagnostic treatment. yes  Smoking Cessation Counseling: Current Smokers:  Discussed importance of smoking cessation. yes Information about tobacco cessation classes and  interventions provided to patient. yes Patient provided with "ticket" for LDCT Scan. yes Symptomatic Patient. no  Counseling NA Diagnosis Code: Tobacco Use Z72.0 Asymptomatic Patient yes  Counseling (Intermediate counseling: > three minutes counseling) [id number] Former Smokers:  Discussed the importance of maintaining cigarette abstinence. yes Diagnosis Code: Personal History of Nicotine  Dependence. X91.478 Information about tobacco cessation classes and interventions provided to patient. Yes Patient provided with "ticket" for LDCT Scan. yes Written Order for Lung Cancer Screening with LDCT placed in Epic. Yes (CT Chest Lung Cancer Screening Low Dose W/O CM) [id number] Z12.2-Screening of respiratory organs Z87.891-Personal history of nicotine  dependence   Raejean Bullock, NP

## 2023-11-20 NOTE — Patient Instructions (Signed)

## 2023-11-22 ENCOUNTER — Ambulatory Visit (HOSPITAL_COMMUNITY)
Admission: RE | Admit: 2023-11-22 | Discharge: 2023-11-22 | Disposition: A | Discharge status: Home / Self Care | Source: Ambulatory Visit | Attending: Acute Care | Admitting: Acute Care

## 2023-11-22 DIAGNOSIS — Z87891 Personal history of nicotine dependence: Secondary | ICD-10-CM | POA: Diagnosis present

## 2023-11-22 DIAGNOSIS — F17210 Nicotine dependence, cigarettes, uncomplicated: Secondary | ICD-10-CM | POA: Diagnosis present

## 2023-11-22 DIAGNOSIS — Z122 Encounter for screening for malignant neoplasm of respiratory organs: Secondary | ICD-10-CM | POA: Diagnosis present

## 2023-12-12 ENCOUNTER — Telehealth: Payer: Self-pay | Admitting: Acute Care

## 2023-12-12 DIAGNOSIS — R911 Solitary pulmonary nodule: Secondary | ICD-10-CM

## 2023-12-12 NOTE — Telephone Encounter (Addendum)
 IMPRESSION: 1. Lung-RADS 4A, suspicious. New 7.6 mm irregular perifissural right middle lobe nodular opacity in a region of new subsegmental atelectasis in the right middle lobe. The nodular component is felt to be most likely related to the associated atelectasis. Follow up low-dose chest CT without contrast in 3 months (please use the following order, "CT CHEST LCS NODULE FOLLOW-UP W/O CM") is recommended. Alternatively, PET may be considered when there is a solid component 8mm or larger. 2.  Emphysema (ICD10-J43.9).   These results will be called to the ordering clinician or representative by the Radiologist Assistant, and communication documented in the PACS or [employer].     Electronically Signed   By: Donnal Fusi M.D.   On: 12/12/2023 08:51

## 2023-12-12 NOTE — Telephone Encounter (Signed)
 I have called the patient with the results of her low-dose CT.  I explained her scan was read as a lung RADS 4A.  She has a new 7.5 mm pulmonary nodule in the right middle lobe.  I did ask her if she had been sick or experiencing a flare of her COPD around May 15 when she had the scan done.  She does endorse that over the last month she has had worsening wheezing and cough. Plan will be for 19-month follow-up low-dose CT.  This will be due around February 22, 2024. Lex Redbird, and Rule please order 76-month follow-up low-dose CT due August 15 or beyond, please fax results to PCP and let her know plan for follow-up.  I did encourage the patient to reach out to her PCP if she feels she is experiencing a COPD flare for treatment.  Patient verbalized understanding of the above.

## 2023-12-13 NOTE — Telephone Encounter (Signed)
 Follow up scan ordered for 02/22/2024. Results and plan sent to PCP.

## 2023-12-13 NOTE — Addendum Note (Signed)
 Addended by: Veryl Gottron on: 12/13/2023 08:36 AM   Modules accepted: Orders

## 2023-12-15 IMAGING — DX DG CHEST 2V
2 series · 2 of 2 positions shown · non-contrast
Comparison: 11/01/2017

CLINICAL DATA: Hemoptysis.  Productive cough

EXAM:
CHEST - 2 VIEW

[chest pa]
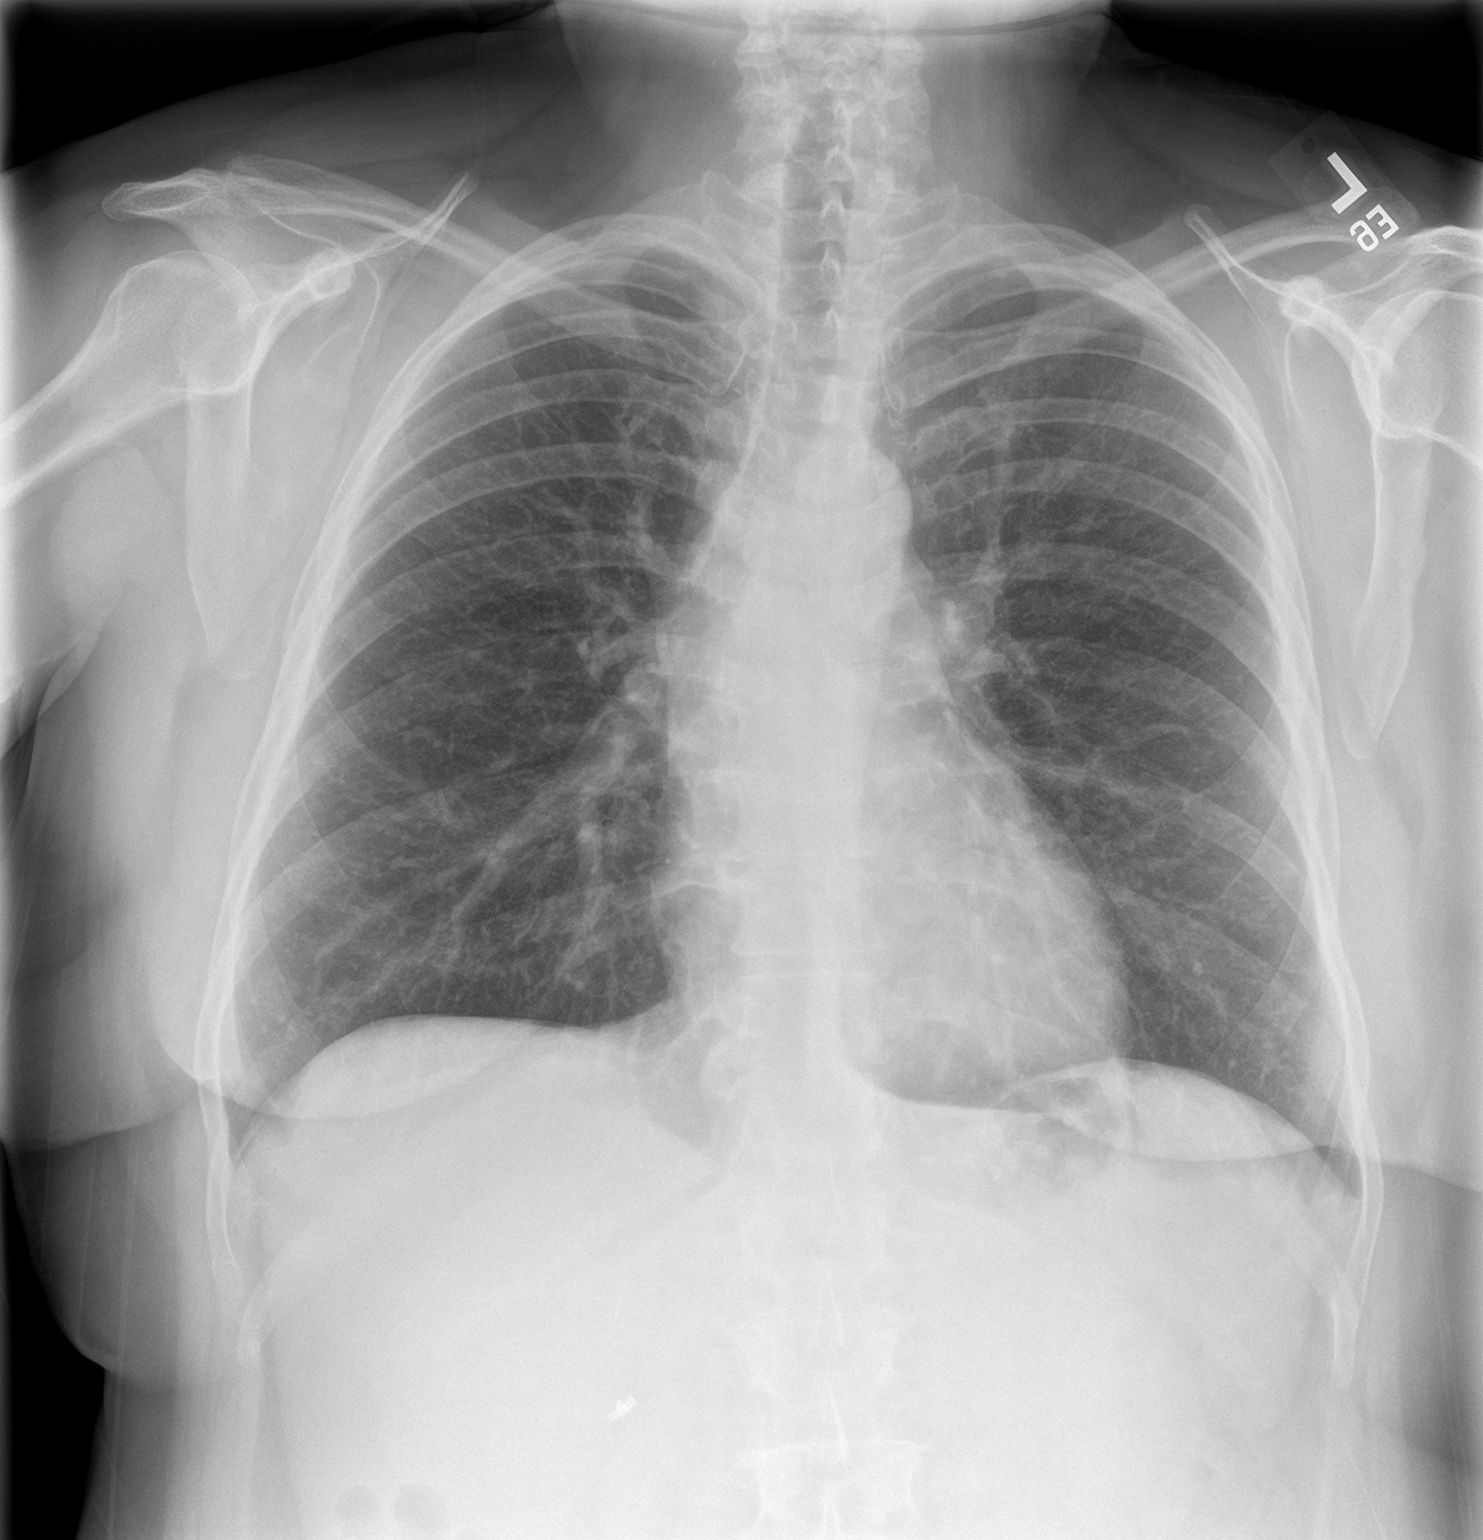

[chest lat]
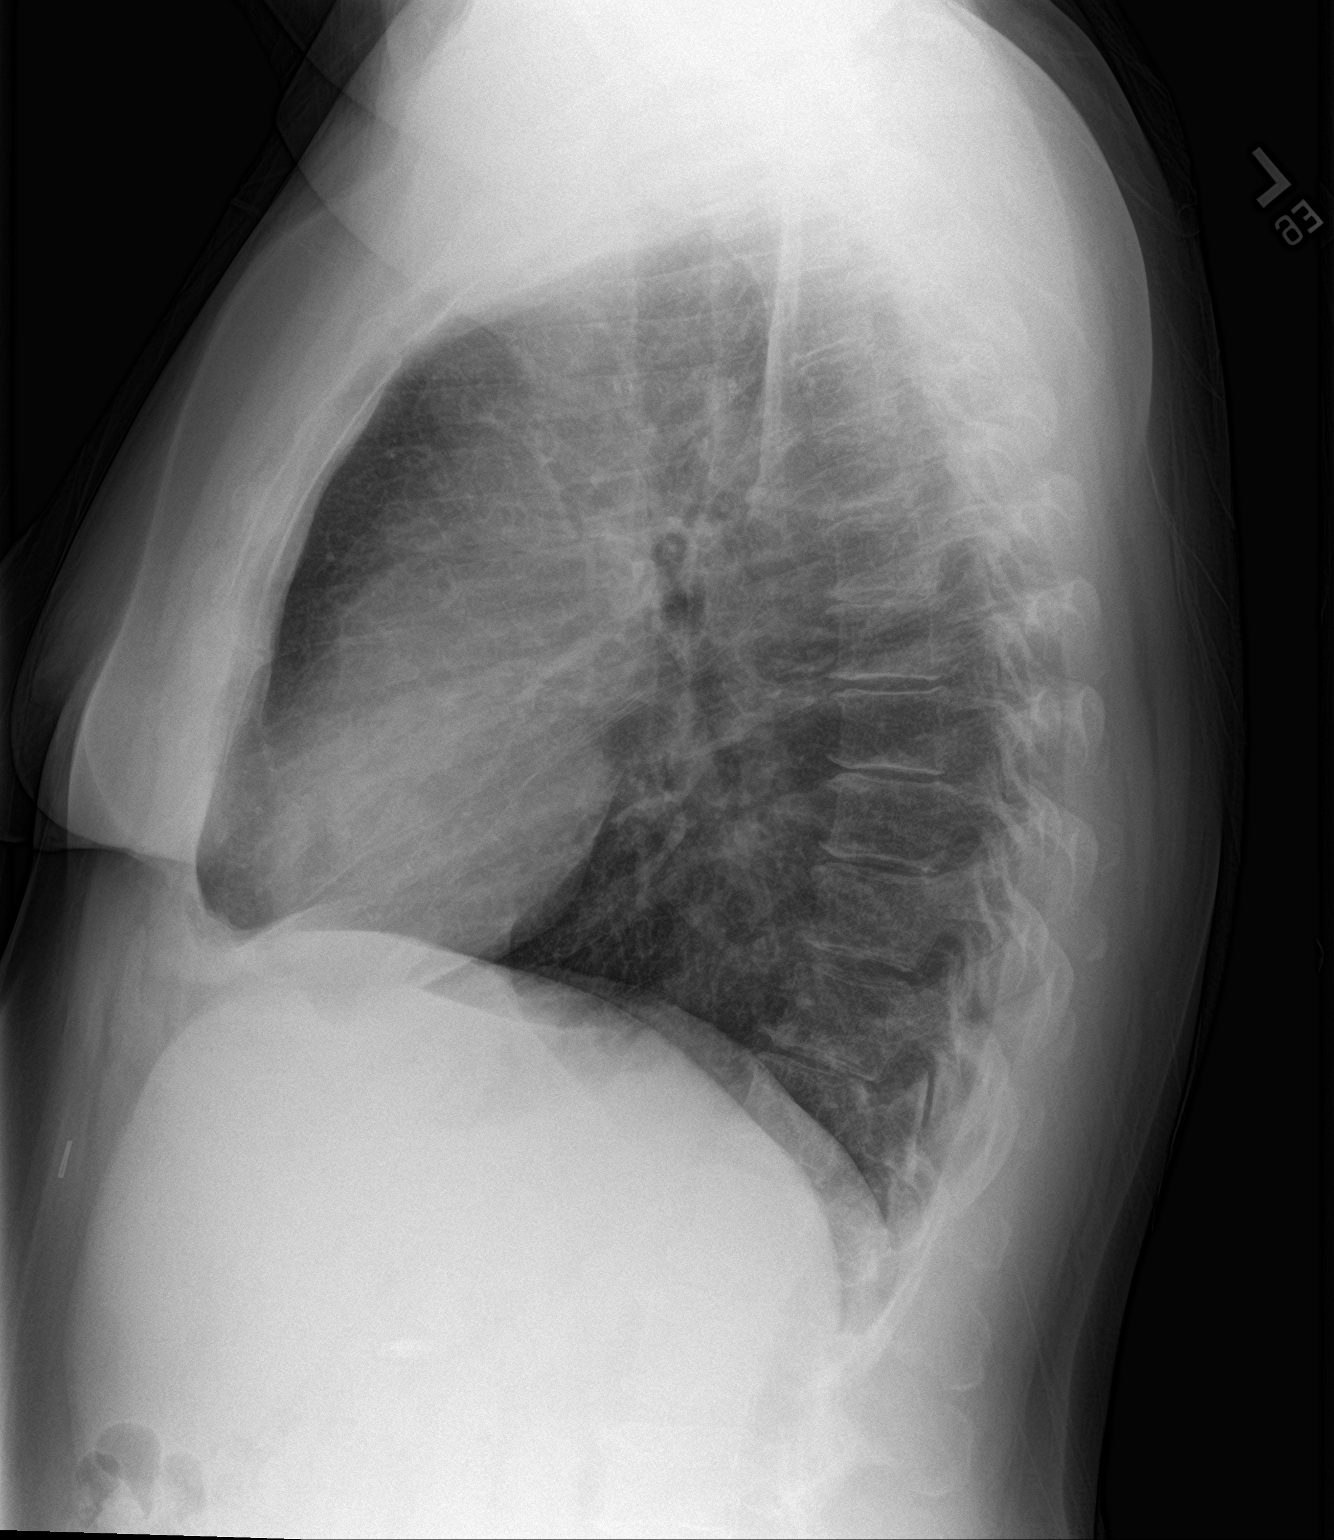

[2 of 2 positions shown; findings below may reference images not displayed]

FINDINGS: The heart size and mediastinal contours are within normal limits.
Both lungs are clear. The visualized skeletal structures are
unremarkable.
IMPRESSION: No active cardiopulmonary disease.

## 2023-12-15 IMAGING — CT CT ANGIO CHEST
2 of 7 series · 18 of 46 positions shown · IV contrast (agent unspecified)
Comparison: Same-day chest radiograph

CLINICAL DATA: Hemoptysis, positive D-dimer

EXAM:
CT ANGIOGRAPHY CHEST WITH CONTRAST
TECHNIQUE: Multidetector CT imaging of the chest was performed using the
standard protocol during bolus administration of intravenous
contrast. Multiplanar CT image reconstructions and MIPs were
obtained to evaluate the vascular anatomy.

[Series 5: pe axial thins · axial · 0.68mm/px · z∈[+1222,+1464]mm · 15 of 340 slices shown]
[im 19/340  lung]
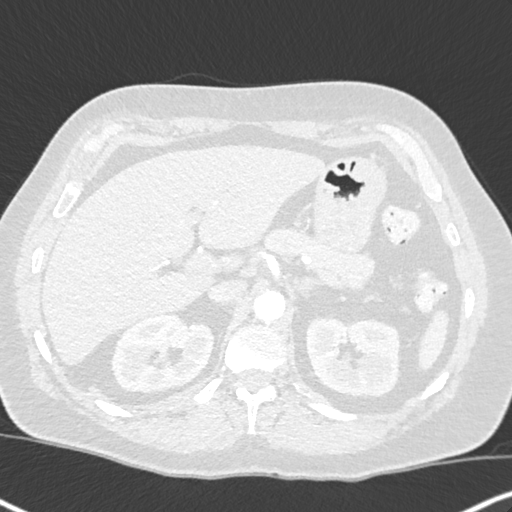
[im 38/340  soft-tissue]
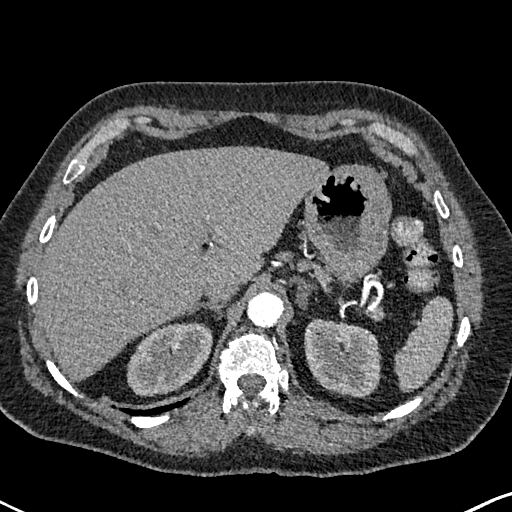
[im 57/340  lung]
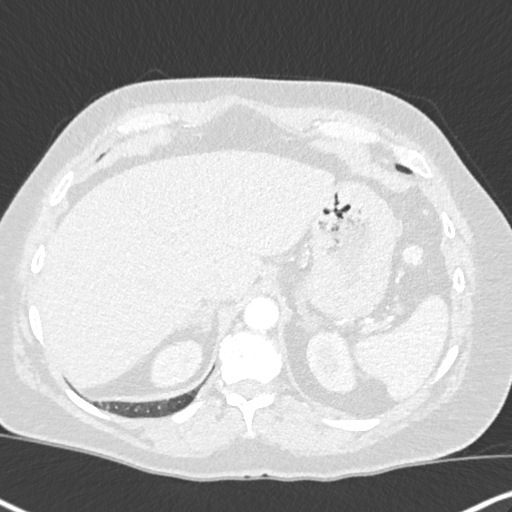
[im 76/340  soft-tissue]
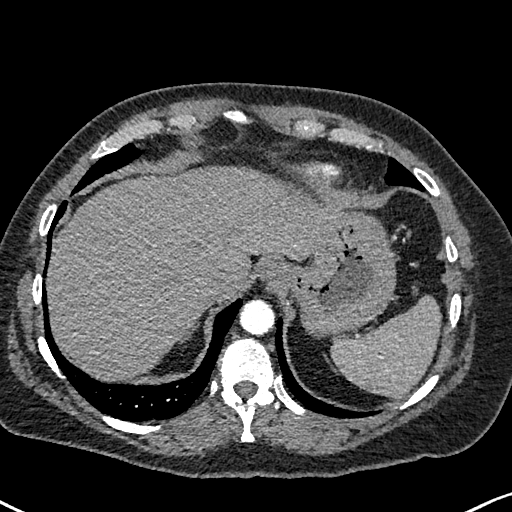
[im 114/340  lung]
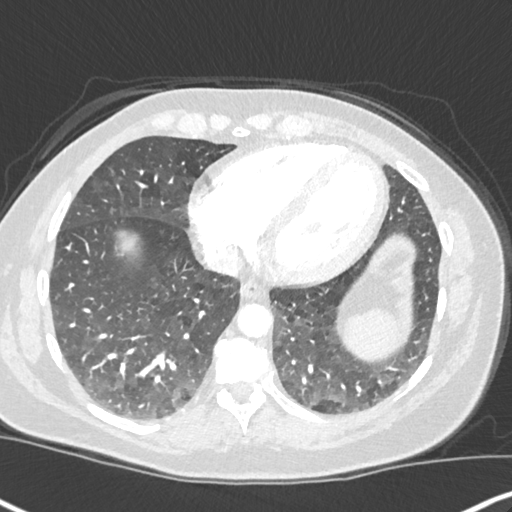
[im 132/340  soft-tissue]
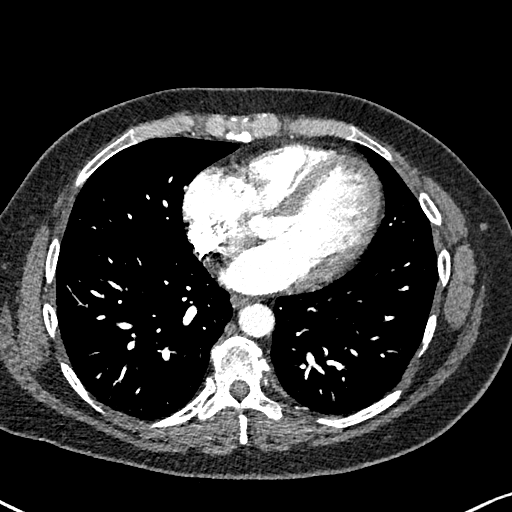
[im 151/340  lung]
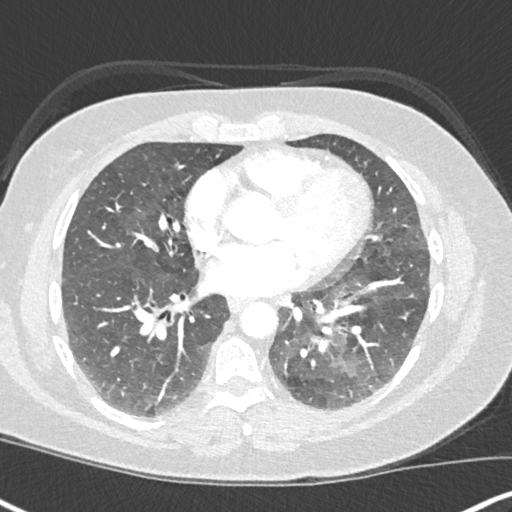
[im 170/340  soft-tissue]
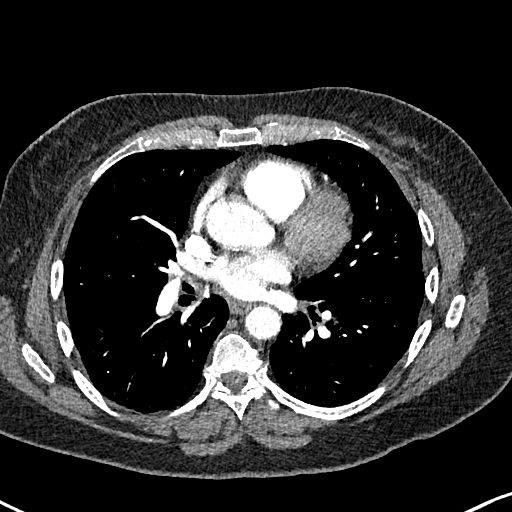
[im 189/340  lung]
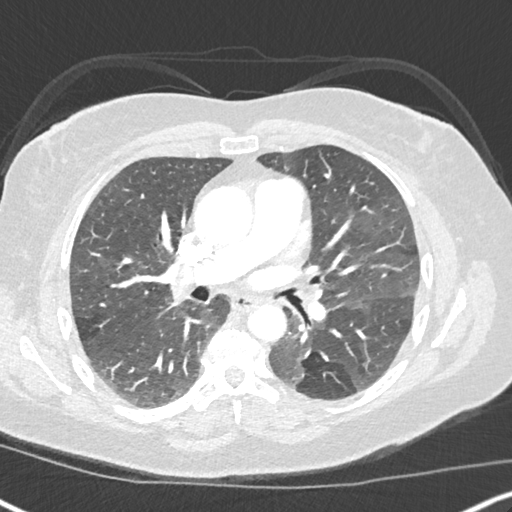
[im 208/340  soft-tissue]
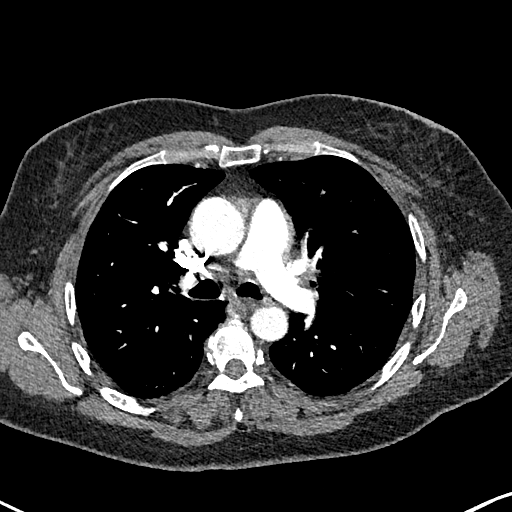
[im 227/340  lung]
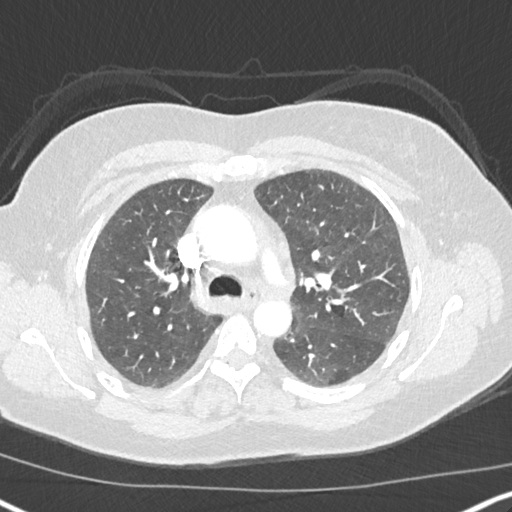
[im 264/340  soft-tissue]
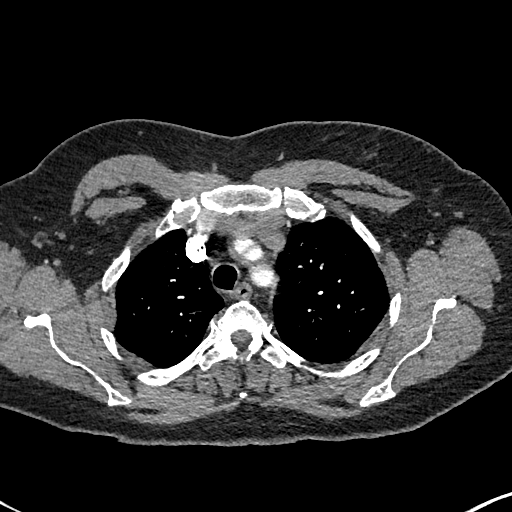
[im 283/340  lung]
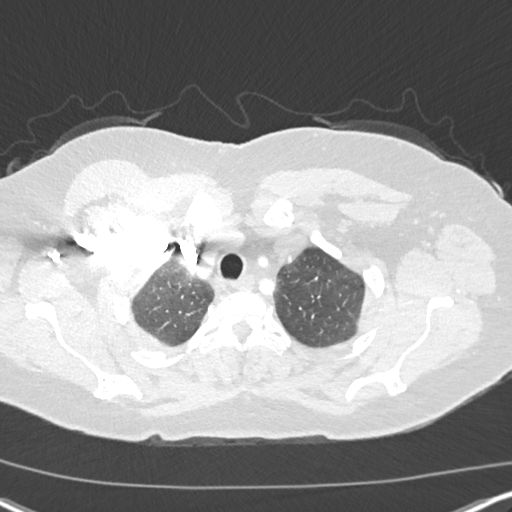
[im 302/340  soft-tissue]
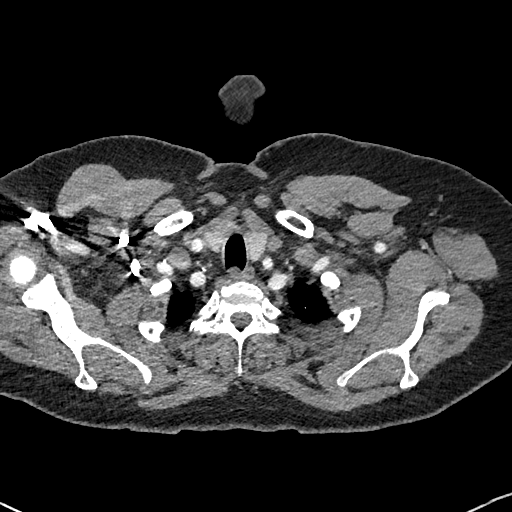
[im 321/340  lung]
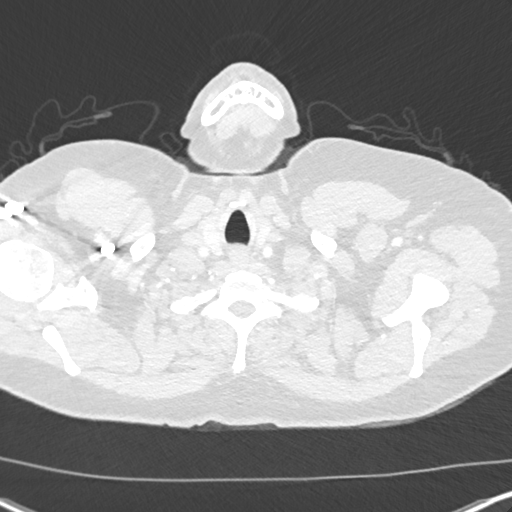

[Series 7: cor soft · coronal · 0.57mm/px · 3 of 151 slices shown]
[im 38/151  soft-tissue]
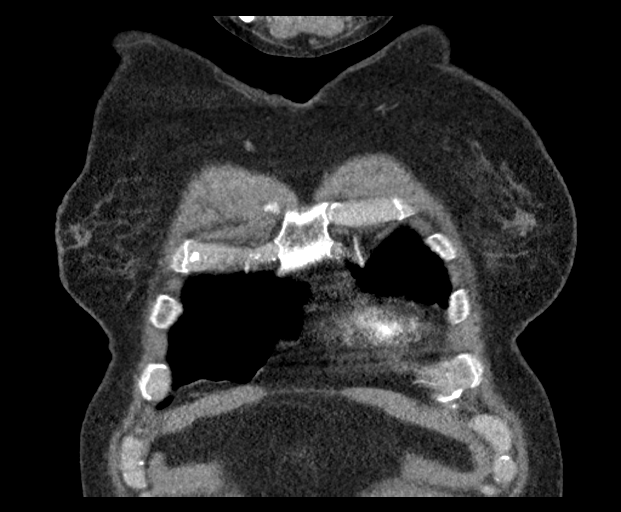
[im 76/151  soft-tissue]
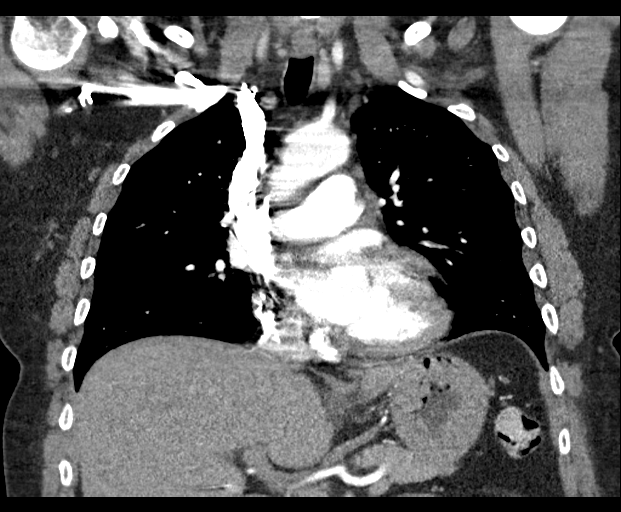
[im 113/151  soft-tissue]
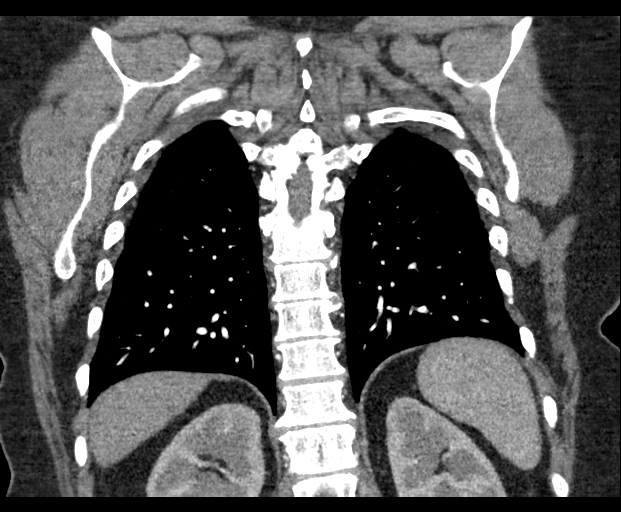

[18 of 46 positions shown; findings below may reference images not displayed]

RADIATION DOSE REDUCTION: This exam was performed according to the
departmental dose-optimization program which includes automated
exposure control, adjustment of the mA and/or kV according to
patient size and/or use of iterative reconstruction technique.

CONTRAST:  75mL OMNIPAQUE IOHEXOL 350 MG/ML SOLN
FINDINGS: Cardiovascular: There is adequate opacification of the pulmonary
arteries to the segmental level. There is no evidence of pulmonary
embolism. The heart is not enlarged. There is no pericardial
effusion. The thoracic aorta is normal in appearance.

Mediastinum/Nodes: The thyroid is unremarkable. The esophagus is
grossly unremarkable. There is no mediastinal, hilar, or axillary
lymphadenopathy.

Lungs/Pleura: Trachea and central airways are patent.

There is extensive mosaic attenuation throughout the lungs. There is
no focal consolidation or pulmonary edema. There is no pleural
effusion or pneumothorax.

Upper Abdomen: Cholecystectomy clips are noted. The imaged portions
of the upper abdominal viscera are otherwise unremarkable.

Musculoskeletal: There is no acute osseous abnormality or aggressive
osseous lesion.

Review of the MIP images confirms the above findings.
IMPRESSION: 1. No evidence of pulmonary embolism.
2. Mosaic attenuation throughout the lungs is suggestive of air
trapping/small airway disease.

## 2024-02-22 ENCOUNTER — Ambulatory Visit (HOSPITAL_COMMUNITY)
Admission: RE | Admit: 2024-02-22 | Discharge: 2024-02-22 | Disposition: A | Discharge status: Home / Self Care | Source: Ambulatory Visit | Attending: Acute Care | Admitting: Acute Care

## 2024-02-22 DIAGNOSIS — R911 Solitary pulmonary nodule: Secondary | ICD-10-CM | POA: Diagnosis present

## 2024-03-14 ENCOUNTER — Other Ambulatory Visit: Payer: Self-pay

## 2024-03-14 ENCOUNTER — Telehealth: Payer: Self-pay

## 2024-03-14 DIAGNOSIS — R911 Solitary pulmonary nodule: Secondary | ICD-10-CM

## 2024-03-14 DIAGNOSIS — Z87891 Personal history of nicotine dependence: Secondary | ICD-10-CM

## 2024-03-14 DIAGNOSIS — Z122 Encounter for screening for malignant neoplasm of respiratory organs: Secondary | ICD-10-CM

## 2024-03-14 NOTE — Telephone Encounter (Signed)
 Spoke with patient and reviewed results of her recent Lung CT. She is in agreement to complete a 6 month follow up scan scheduled for 08/26/2023 at [facility]. Order placed. Results and plan to PCP.   IMPRESSION: 1. Stable nodular consolidation in the medial segment right middle lobe. Lung-RADS 3, probably benign findings. Short-term follow-up in 6 months is recommended with repeat low-dose chest CT without contrast (please use the following order, CT CHEST LCS NODULE FOLLOW-UP W/O CM). 2.  Age advanced three-vessel coronary artery calcification. 3. Small bilateral adrenal adenomas. 4.  Emphysema (ICD10-J43.9).

## 2024-08-27 ENCOUNTER — Ambulatory Visit (HOSPITAL_COMMUNITY)

## 2024-11-13 ENCOUNTER — Encounter (INDEPENDENT_AMBULATORY_CARE_PROVIDER_SITE_OTHER): Admitting: Ophthalmology
